# Patient Record
Sex: Female | Born: 1955 | Race: Black or African American | Marital: Single | Smoking: Never smoker
Health system: Southern US, Community
[De-identification: ages and names within clinical notes are randomized; demographics above are authoritative.]

## PROBLEM LIST (undated history)

## (undated) DIAGNOSIS — N189 Chronic kidney disease, unspecified: Secondary | ICD-10-CM

## (undated) DIAGNOSIS — I10 Essential (primary) hypertension: Secondary | ICD-10-CM

## (undated) DIAGNOSIS — I639 Cerebral infarction, unspecified: Secondary | ICD-10-CM

## (undated) DIAGNOSIS — D509 Iron deficiency anemia, unspecified: Secondary | ICD-10-CM

## (undated) HISTORY — PX: TUBAL LIGATION: SHX77

---

## 2014-01-07 ENCOUNTER — Other Ambulatory Visit: Payer: Self-pay | Admitting: Nephrology

## 2014-01-07 DIAGNOSIS — N183 Chronic kidney disease, stage 3 unspecified: Secondary | ICD-10-CM

## 2014-01-07 DIAGNOSIS — I10 Essential (primary) hypertension: Secondary | ICD-10-CM

## 2014-01-24 ENCOUNTER — Other Ambulatory Visit: Payer: Self-pay

## 2014-01-27 ENCOUNTER — Ambulatory Visit
Admission: RE | Admit: 2014-01-27 | Discharge: 2014-01-27 | Disposition: A | Discharge status: Home / Self Care | Payer: Medicaid Other | Source: Ambulatory Visit | Attending: Nephrology | Admitting: Nephrology

## 2014-01-27 DIAGNOSIS — I10 Essential (primary) hypertension: Secondary | ICD-10-CM

## 2014-01-27 DIAGNOSIS — N183 Chronic kidney disease, stage 3 unspecified: Secondary | ICD-10-CM

## 2014-02-27 ENCOUNTER — Inpatient Hospital Stay (HOSPITAL_COMMUNITY)
Admission: AD | Admit: 2014-02-27 | Discharge: 2014-03-01 | DRG: 682 | Disposition: A | Discharge status: Home / Self Care | Payer: Medicaid Other | Source: Other Acute Inpatient Hospital | Attending: Internal Medicine | Admitting: Internal Medicine

## 2014-02-27 ENCOUNTER — Encounter (HOSPITAL_COMMUNITY): Payer: Self-pay | Admitting: Family Medicine

## 2014-02-27 DIAGNOSIS — I129 Hypertensive chronic kidney disease with stage 1 through stage 4 chronic kidney disease, or unspecified chronic kidney disease: Secondary | ICD-10-CM | POA: Diagnosis present

## 2014-02-27 DIAGNOSIS — I959 Hypotension, unspecified: Secondary | ICD-10-CM | POA: Diagnosis present

## 2014-02-27 DIAGNOSIS — Z8673 Personal history of transient ischemic attack (TIA), and cerebral infarction without residual deficits: Secondary | ICD-10-CM | POA: Diagnosis not present

## 2014-02-27 DIAGNOSIS — E876 Hypokalemia: Secondary | ICD-10-CM | POA: Diagnosis not present

## 2014-02-27 DIAGNOSIS — Z8249 Family history of ischemic heart disease and other diseases of the circulatory system: Secondary | ICD-10-CM | POA: Diagnosis not present

## 2014-02-27 DIAGNOSIS — Z23 Encounter for immunization: Secondary | ICD-10-CM | POA: Diagnosis not present

## 2014-02-27 DIAGNOSIS — Z841 Family history of disorders of kidney and ureter: Secondary | ICD-10-CM

## 2014-02-27 DIAGNOSIS — E860 Dehydration: Secondary | ICD-10-CM | POA: Diagnosis present

## 2014-02-27 DIAGNOSIS — K029 Dental caries, unspecified: Secondary | ICD-10-CM | POA: Diagnosis present

## 2014-02-27 DIAGNOSIS — N179 Acute kidney failure, unspecified: Secondary | ICD-10-CM | POA: Diagnosis present

## 2014-02-27 DIAGNOSIS — J189 Pneumonia, unspecified organism: Secondary | ICD-10-CM | POA: Diagnosis present

## 2014-02-27 DIAGNOSIS — J42 Unspecified chronic bronchitis: Secondary | ICD-10-CM | POA: Diagnosis present

## 2014-02-27 DIAGNOSIS — Z7982 Long term (current) use of aspirin: Secondary | ICD-10-CM | POA: Diagnosis not present

## 2014-02-27 DIAGNOSIS — I10 Essential (primary) hypertension: Secondary | ICD-10-CM | POA: Diagnosis present

## 2014-02-27 DIAGNOSIS — I639 Cerebral infarction, unspecified: Secondary | ICD-10-CM | POA: Diagnosis present

## 2014-02-27 DIAGNOSIS — N184 Chronic kidney disease, stage 4 (severe): Secondary | ICD-10-CM | POA: Diagnosis present

## 2014-02-27 DIAGNOSIS — E875 Hyperkalemia: Secondary | ICD-10-CM | POA: Diagnosis present

## 2014-02-27 DIAGNOSIS — N170 Acute kidney failure with tubular necrosis: Secondary | ICD-10-CM | POA: Diagnosis present

## 2014-02-27 DIAGNOSIS — N182 Chronic kidney disease, stage 2 (mild): Secondary | ICD-10-CM | POA: Diagnosis present

## 2014-02-27 HISTORY — DX: Essential (primary) hypertension: I10

## 2014-02-27 HISTORY — DX: Cerebral infarction, unspecified: I63.9

## 2014-02-27 HISTORY — DX: Chronic kidney disease, unspecified: N18.9

## 2014-02-27 HISTORY — DX: Iron deficiency anemia, unspecified: D50.9

## 2014-02-27 LAB — GLUCOSE, CAPILLARY
GLUCOSE-CAPILLARY: 108 mg/dL — AB (ref 70–99)
Glucose-Capillary: 74 mg/dL (ref 70–99)
Glucose-Capillary: 159 mg/dL — ABNORMAL HIGH (ref 70–99)

## 2014-02-27 LAB — CBC
HCT: 40.5 % (ref 36.0–46.0)
Hemoglobin: 13.3 g/dL (ref 12.0–15.0)
MCH: 24.3 pg — AB (ref 26.0–34.0)
MCHC: 32.8 g/dL (ref 30.0–36.0)
MCV: 73.9 fL — ABNORMAL LOW (ref 78.0–100.0)
PLATELETS: 214 10*3/uL (ref 150–400)
RBC: 5.48 MIL/uL — ABNORMAL HIGH (ref 3.87–5.11)
RDW: 15 % (ref 11.5–15.5)
WBC: 10.2 10*3/uL (ref 4.0–10.5)

## 2014-02-27 LAB — BASIC METABOLIC PANEL
BUN: 81 mg/dL — ABNORMAL HIGH (ref 6–23)
BUN: 70 mg/dL — AB (ref 6–23)
CALCIUM: 9.6 mg/dL (ref 8.4–10.5)
CO2: 22 mEq/L (ref 19–32)
CO2: 25 mEq/L (ref 19–32)
Calcium: 8.8 mg/dL (ref 8.4–10.5)
Chloride: 94 mEq/L — ABNORMAL LOW (ref 96–112)
Chloride: 93 mEq/L — ABNORMAL LOW (ref 96–112)
Creatinine, Ser: 10.52 mg/dL — ABNORMAL HIGH (ref 0.50–1.10)
Creatinine, Ser: 7.29 mg/dL — ABNORMAL HIGH (ref 0.50–1.10)
GFR calc Af Amer: 4 mL/min — ABNORMAL LOW (ref >90)
GFR calc non Af Amer: 4 mL/min — ABNORMAL LOW (ref >90)
GFR, EST AFRICAN AMERICAN: 6 mL/min — AB (ref >90)
GFR, EST NON AFRICAN AMERICAN: 6 mL/min — AB (ref >90)
GLUCOSE: 96 mg/dL (ref 70–99)
Glucose, Bld: 87 mg/dL (ref 70–99)
Potassium: 4.7 mEq/L (ref 3.7–5.3)
Potassium: 3.6 mEq/L — ABNORMAL LOW (ref 3.7–5.3)
SODIUM: 136 meq/L — AB (ref 137–147)
Sodium: 143 mEq/L (ref 137–147)

## 2014-02-27 MED ORDER — SODIUM CHLORIDE 0.9 % IJ SOLN
3.0000 mL | Freq: Two times a day (BID) | INTRAMUSCULAR | Status: DC
  Administered 2014-02-27 – 2014-02-28 (×3): 3 mL via INTRAVENOUS

## 2014-02-27 MED ORDER — HEPARIN SODIUM (PORCINE) 5000 UNIT/ML IJ SOLN
5000.0000 [IU] | Freq: Three times a day (TID) | INTRAMUSCULAR | Status: DC
  Administered 2014-02-27 – 2014-03-01 (×6): 5000 [IU] via SUBCUTANEOUS
  Filled 2014-02-27 (×9): qty 1

## 2014-02-27 MED ORDER — SODIUM CHLORIDE 0.9 % IV SOLN
INTRAVENOUS | Status: DC
  Administered 2014-02-27: 300 mL via INTRAVENOUS

## 2014-02-27 NOTE — H&P (Signed)
[facility] History and Physical  Essie ChristineGail Caputo WUJ:[id number] DOB: [birth year] DOA: 02/27/2014  [facility] PCP: No PCP Per Patient  Specialists: none yet  Chief Complaint:    HPI: 58 y/o ?, known h/o hypertension, potential early kidney disease [ had ultrasound performed there] CVA 2013-follows with [facility] in [place] had her teeth extracted on 02/15/14. She started to take ibuprofen 400 mg tablets every 6 hourly scheduled and then on 5/14 started having nausea vomiting and inability to take by mouth. She has been eating very [place] drinking very [place] in addition to taking her lisinopril as scheduled. She started to feel 100 dizzy and weak this morning and because she was feeling ill, was [facility].  Workup there revealed potassium of 6 creatinine of 12 estimated GFR of 3 EKG showed some T wave elevations WBC 11.6, hemoglobin 14  Patient was transferred over for potential need for acute dialysis however she tells me she would not really want dialysis      Review of Systems: The patient denies fever chills nausea vomiting chest pain blurred vision double vision weakness in any one side of the body shortness of breath She states that she's been passing less urine than usual the past couple of days. Mildly constipated   No past medical history on file. No past surgical history on file. Social History:  History   Social History Narrative  . No narrative on file    Allergies not on file  Family History  Problem Relation Age of Onset  . CAD Father   . Renal Disease Mother     dilaysis  . Hypertension Mother     Prior to Admission medications   Not on File   Physical Exam: Filed Vitals:   02/27/14 1232  BP: 97/63  Pulse: 80  Temp: 97.6 F (36.4 C)  TempSrc: Oral  Resp: 18  Height: 5\' 6"  (1.676 m)  Weight: 55.792 kg (123 lb)  SpO2: 97%     General:  Eomi, ncat-frail  Eyes:  no pallor no  icterus  ENT:  saw supple no JVD noted  Neck:  JVD no bruit  Cardiovascular:  S1-S2 no murmur or gallop  Respiratory:  clinically clear  Abdomen:  soft nontender nondistended  Skin:  no lower extremity edema  Musculoskeletal:  range of motion intact  Psychiatric:  euthymic  Neurologic:  grossly intact  Labs on Admission:  Basic Metabolic Panel: No results found for this basename: NA, K, CL, CO2, GLUCOSE, BUN, CREATININE, CALCIUM, MG, PHOS,  in the last 168 hours Liver Function Tests: No results found for this basename: AST, ALT, ALKPHOS, BILITOT, PROT, ALBUMIN,  in the last 168 hours No results found for this basename: LIPASE, AMYLASE,  in the last 168 hours No results found for this basename: AMMONIA,  in the last 168 hours CBC: No results found for this basename: WBC, NEUTROABS, HGB, HCT, MCV, PLT,  in the last 168 hours Cardiac Enzymes: No results found for this basename: CKTOTAL, CKMB, CKMBINDEX, TROPONINI,  in the last 168 hours  BNP (last 3 results) No results found for this basename: PROBNP,  in the last 8760 hours CBG:  Recent Labs Lab 02/27/14 1251  GLUCAP 74    Radiological Exams on Admission: No results found.  EKG: Independently reviewed.  as above  Assessment/Plan Active Problems:   AKI (acute kidney injury)-hydrate with saline 75 200 cc per hour. The neck acute 12. Kayexalate to be given a  potassium above 5.5 and nursing order placed. Strict I&O monitoring. If kidney function does not improve we will obtain nephrology consult. CalculatedFeNA and urine sodium/creatinine already sent--patient doesn't wish dialysis and hopefully this will resolve   Hyperkalemia secondary to above. Repeat Bmet q 8-minute right heart depending on results    Chronic kidney disease (CKD), stage IV (severe)As above   HTN (hypertension)-allow pressure to be elevated given moderate hypotension   dizziness-secondary to volume depletion NSAID use and poor by mouth intake.  Hydrate 100 cc per hour give diet   Stroke 2013-await restarting regular meds   Caries S/P 8 tooth extraction 02/15/14 cautioned regarding NSAIDs will follow up in-    Time spent:  45 Full family updated at bedside full code   Rhetta MuraJai-Gurmukh Calani Gick [facility] Pager [phone]  If 7PM-7AM, please contact night-coverage [email or web address] Password [email or web address] 02/27/2014, 1:05 PM

## 2014-02-28 ENCOUNTER — Inpatient Hospital Stay (HOSPITAL_COMMUNITY): Payer: Medicaid Other

## 2014-02-28 LAB — BASIC METABOLIC PANEL
BUN: 66 mg/dL — ABNORMAL HIGH (ref 6–23)
CALCIUM: 8.7 mg/dL (ref 8.4–10.5)
CO2: 25 meq/L (ref 19–32)
Chloride: 94 mEq/L — ABNORMAL LOW (ref 96–112)
Creatinine, Ser: 5.45 mg/dL — ABNORMAL HIGH (ref 0.50–1.10)
GFR calc Af Amer: 9 mL/min — ABNORMAL LOW (ref >90)
GFR calc non Af Amer: 8 mL/min — ABNORMAL LOW (ref >90)
GLUCOSE: 90 mg/dL (ref 70–99)
Potassium: 3.4 mEq/L — ABNORMAL LOW (ref 3.7–5.3)
Sodium: 135 mEq/L — ABNORMAL LOW (ref 137–147)

## 2014-02-28 LAB — GLUCOSE, CAPILLARY
GLUCOSE-CAPILLARY: 120 mg/dL — AB (ref 70–99)
Glucose-Capillary: 90 mg/dL (ref 70–99)
Glucose-Capillary: 103 mg/dL — ABNORMAL HIGH (ref 70–99)
Glucose-Capillary: 110 mg/dL — ABNORMAL HIGH (ref 70–99)

## 2014-02-28 LAB — URINALYSIS, ROUTINE W REFLEX MICROSCOPIC
BILIRUBIN URINE: NEGATIVE
Glucose, UA: NEGATIVE mg/dL
Ketones, ur: 15 mg/dL — AB
Nitrite: POSITIVE — AB
PH: 5.5 (ref 5.0–8.0)
Protein, ur: NEGATIVE mg/dL
SPECIFIC GRAVITY, URINE: 1.021 (ref 1.005–1.030)
Urobilinogen, UA: 1 mg/dL (ref 0.0–1.0)

## 2014-02-28 LAB — CREATININE, URINE, RANDOM: Creatinine, Urine: 207.59 mg/dL

## 2014-02-28 LAB — URINE MICROSCOPIC-ADD ON

## 2014-02-28 LAB — SODIUM, URINE, RANDOM: Sodium, Ur: 51 mEq/L

## 2014-02-28 MED ORDER — LEVOFLOXACIN IN D5W 750 MG/150ML IV SOLN
750.0000 mg | Freq: Once | INTRAVENOUS | Status: AC
  Administered 2014-02-28: 750 mg via INTRAVENOUS
  Filled 2014-02-28: qty 150

## 2014-02-28 MED ORDER — LEVOFLOXACIN IN D5W 500 MG/100ML IV SOLN: 500.0000 mg | INTRAVENOUS | Status: DC

## 2014-02-28 MED ORDER — SODIUM CHLORIDE 0.9 % IV SOLN
INTRAVENOUS | Status: DC
  Administered 2014-02-28 – 2014-03-01 (×3): via INTRAVENOUS

## 2014-02-28 MED ORDER — SODIUM CHLORIDE 0.9 % IV BOLUS (SEPSIS)
500.0000 mL | Freq: Once | INTRAVENOUS | Status: AC
  Administered 2014-02-28: 13:00:00 via INTRAVENOUS

## 2014-02-28 MED ORDER — ENSURE COMPLETE PO LIQD
237.0000 mL | Freq: Three times a day (TID) | ORAL | Status: DC
  Administered 2014-02-28 – 2014-03-01 (×4): 237 mL via ORAL

## 2014-02-28 NOTE — Progress Notes (Signed)
[facility] PROGRESS NOTE  Caroline Moore ZOX:[id number] DOB: 1956-06-05 DOA: 02/27/2014 PCP: No PCP Per Patient  Assessment/Plan: 58 y/o with PMH of HTN, ?CKD had her teeth extracted on 5/15 was taking PO NSAID+ACE with poor PO intake found to have AKI , hyper K  -[facility]. potassium of 6 creatinine of 12   1. AKI on CKD (unclear history) likely ATN due to NSAID+ACE with poor PO intake  -renal US (12/2013): No Doppler suggestion of hemodynamically significant renal artery stenosis or other lesion   -Cr is imp[roving; not clear I/O, but patient is not oliguric  -cont iVF/increase; obtain UA; strict I/O, daily weight;   2. HTN currently dehydration hypotensive;  -hold BP meds; monitor   3. Tobacco, chronic bronchitis; obtain CXR; cont bronchodilators prn;  -stop smoking   4. Hyper K due to #1; improving, currently mild hypo K; replace prn   Code Status: full Family Communication:  D/w patient  (indicate person spoken with, relationship, and if by phone, the number) Disposition Plan: home 24-48 hours    Consultants:  none  Procedures:  none  Antibiotics:  none (indicate start date, and stop date if known)  HPI/Subjective: alert  Objective: Filed Vitals:   02/28/14 0506  BP: 79/63  Pulse: 80  Temp:   Resp:     Intake/Output Summary (Last 24 hours) at 02/28/14 1040 Last data filed at 02/28/14 0225  Gross per 24 hour  Intake     10 ml  Output    200 ml  Net   -190 ml   Filed Weights   02/27/14 1232 02/28/14 0500  Weight: 55.792 kg (123 lb) 55.52 kg (122 lb 6.4 oz)    Exam:   General:  alert  Cardiovascular: s1,s2 rrr  Respiratory: fewcrackles   Abdomen: soft, nt,nd   Musculoskeletal: no LE edema   Data Reviewed: Basic Metabolic Panel:  Recent Labs Lab 02/27/14 1447 02/27/14 2205 02/28/14 0511  NA 143 136* 135*  K 4.7 3.6* 3.4*  CL 94* 93* 94*  CO2 22 25 25   GLUCOSE 96 87 90  BUN 81* 70* 66*  CREATININE 10.52*  7.29* 5.45*  CALCIUM 9.6 8.8 8.7   Liver Function Tests: No results found for this basename: AST, ALT, ALKPHOS, BILITOT, PROT, ALBUMIN,  in the last 168 hours No results found for this basename: LIPASE, AMYLASE,  in the last 168 hours No results found for this basename: AMMONIA,  in the last 168 hours CBC:  Recent Labs Lab 02/27/14 1447  WBC 10.2  HGB 13.3  HCT 40.5  MCV 73.9*  PLT 214   Cardiac Enzymes: No results found for this basename: CKTOTAL, CKMB, CKMBINDEX, TROPONINI,  in the last 168 hours BNP (last 3 results) No results found for this basename: PROBNP,  in the last 8760 hours CBG:  Recent Labs Lab 02/27/14 1251 02/27/14 1622 02/27/14 2100 02/28/14 0751  GLUCAP 74 159* 108* 90    No results found for this or any previous visit (from the past 240 hour(s)).   Studies: No results found.  Scheduled Meds: . heparin  5,000 Units Subcutaneous 3 times per day  . sodium chloride  3 mL Intravenous Q12H   Continuous Infusions: . sodium chloride 300 mL (02/27/14 1504)    Active Problems:   AKI (acute kidney injury)   Chronic kidney disease (CKD), stage IV (severe)   HTN (hypertension)   Stroke 2013   Caries S/P 8 tooth extraction 02/15/14  Time spent: >35 minutes     Esperanza SheetsUlugbek N Mahaila Tischer  [facility] Pager [phone]. If 7PM-7AM, please contact night-coverage at [email or web address], password [email or web address] 02/28/2014, 10:40 AM  LOS: 1 day

## 2014-02-28 NOTE — Progress Notes (Signed)
INITIAL NUTRITION ASSESSMENT  DOCUMENTATION CODES Per approved criteria  -Not Applicable   INTERVENTION:  Ensure Complete PO TID, each supplement provides 350 kcal and 13 grams of protein  Recommend liberalize diet to regular, patient no longer needs a potassium restriction with K+ level of 3.4.  NUTRITION DIAGNOSIS: Inadequate oral intake related to difficulty chewing as evidenced by weight loss and intake of meals </= 25%.   Goal: Intake to meet >90% of estimated nutrition needs.  Monitor:  PO intake, labs, weight trend.  Reason for Assessment: MST  58 y.o. female  Admitting Dx: AKI on CKD  ASSESSMENT: 58 y/o with PMH of HTN, ?CKD had her teeth extracted on 5/5 was taking PO NSAID+ACE with poor PO intake found to have AKI , hyper K. Workup there revealed potassium of 6 creatinine of 12 estimated GFR of 3; EKG showed some T wave elevations; WBC 11.6, hemoglobin 14  Patient reports that she has been eating very poorly due to mouth pain from recent teeth extraction. She has also been feeling nauseous. She thinks that she has lost 5-10 lbs over the past two weeks.  Potassium now low at 3.4.  Nutrition Focused Physical Exam:  Subcutaneous Fat:  Orbital Region: WNL Upper Arm Region: WNL Thoracic and Lumbar Region: WNL  Muscle:  Temple Region: WNL Clavicle Bone Region: WNL Clavicle and Acromion Bone Region: WNL Scapular Bone Region: WNL Dorsal Hand: WNL Patellar Region: mild depletion Anterior Thigh Region: mild depletion Posterior Calf Region: mild depletion  Edema: no LE edema   Height: Ht Readings from Last 1 Encounters:  02/27/14 5\' 6"  (1.676 m)    Weight: Wt Readings from Last 1 Encounters:  02/28/14 122 lb 6.4 oz (55.52 kg)    Ideal Body Weight: 59.1 kg  % Ideal Body Weight: 94%  Wt Readings from Last 10 Encounters:  02/28/14 122 lb 6.4 oz (55.52 kg)    Usual Body Weight: 125 lb  % Usual Body Weight: 98%  BMI:  Body mass index is 19.77  kg/(m^2).  Estimated Nutritional Needs: Kcal: 1400-1600 Protein: 70-85 gm Fluid: 1.4-1.6 L  Skin: no wounds  Diet Order: Renal with 1200 ml fluid restriction  EDUCATION NEEDS: -Education needs addressed   Intake/Output Summary (Last 24 hours) at 02/28/14 1425 Last data filed at 02/28/14 1356  Gross per 24 hour  Intake   1630 ml  Output    200 ml  Net   1430 ml    Last BM: 5/17   Labs:   Recent Labs Lab 02/27/14 1447 02/27/14 2205 02/28/14 0511  NA 143 136* 135*  K 4.7 3.6* 3.4*  CL 94* 93* 94*  CO2 22 25 25   BUN 81* 70* 66*  CREATININE 10.52* 7.29* 5.45*  CALCIUM 9.6 8.8 8.7  GLUCOSE 96 87 90    CBG (last 3)   Recent Labs  02/27/14 2100 02/28/14 0751 02/28/14 1149  GLUCAP 108* 90 103*    Scheduled Meds: . heparin  5,000 Units Subcutaneous 3 times per day  . sodium chloride  3 mL Intravenous Q12H    Continuous Infusions: . sodium chloride 150 mL/hr at 02/28/14 1315    Past Medical History  Diagnosis Date  . Anemia, iron deficiency   . Hypertension   . Stroke   . Chronic kidney disease     No past surgical history on file.   Joaquin CourtsKimberly Harris, RD, LDN, CNSC Pager [phone] After Hours Pager [phone]

## 2014-02-28 NOTE — Progress Notes (Signed)
ANTIBIOTIC CONSULT NOTE - INITIAL  Pharmacy Consult for levaquin Indication: CAP and UTI  No Known Allergies  Patient Measurements: Height: 5\' 6"  (167.6 cm) Weight: 122 lb 6.4 oz (55.52 kg) IBW/kg (Calculated) : 59.3   Vital Signs: Temp: 97.8 F (36.6 C) (05/18 1354) Temp src: Oral (05/18 1354) BP: 111/45 mmHg (05/18 1354) Pulse Rate: 65 (05/18 1354) Intake/Output from previous day: 05/17 0701 - 05/18 0700 In: 10 [P.O.:10] Out: 200 [Urine:200] Intake/Output from this shift: Total I/O In: 1620 [P.O.:1120; I.V.:500] Out: -   Labs:  Recent Labs  02/27/14 1447 02/27/14 2205 02/28/14 0225 02/28/14 0511  WBC 10.2  --   --   --   HGB 13.3  --   --   --   PLT 214  --   --   --   LABCREA  --   --  207.59  --   CREATININE 10.52* 7.29*  --  5.45*   Estimated Creatinine Clearance: 10.1 ml/min (by C-G formula based on Cr of 5.45). No results found for this basename: VANCOTROUGH, VANCOPEAK, VANCORANDOM, GENTTROUGH, GENTPEAK, GENTRANDOM, TOBRATROUGH, TOBRAPEAK, TOBRARND, AMIKACINPEAK, AMIKACINTROU, AMIKACIN,  in the last 72 hours   Microbiology: No results found for this or any previous visit (from the past 720 hour(s)).  Medical History: Past Medical History  Diagnosis Date  . Anemia, iron deficiency   . Hypertension   . Stroke   . Chronic kidney disease     Medications:  Prescriptions prior to admission  Medication Sig Dispense Refill  . aspirin EC 81 MG tablet Take 81 mg by mouth daily.      Marland Kitchen. lisinopril (PRINIVIL,ZESTRIL) 20 MG tablet Take 40 mg by mouth every evening.      . penicillin v potassium (VEETID) 500 MG tablet Take 500 mg by mouth 4 (four) times daily.       Assessment: 58 yo F to start levaquin per pharmacy for CAP and UTI.  PMH: HTN, CKD. Teeth extracted 5/15 and taking PO NSAID and ACE with poor PO intake.  Found to have AKI with K 6 and creatinine of 12.   Cr is improving. Urine microscopic add on: few sq cellls, many bacteria, may WBC.  WBC 10.7,  creat 10.52>7.25>5.45.  Afebrile.  5/18  CXR: mild LUL infiltrate 5/18 levaquin>>   Goal of Therapy:  Eradicate infection  Plan:  1. levaquin 750 mg IV x 1 dose, then levaquin 500 mg IV q48hrs 2. F/u renal fxn, wbc, temp, CXR, culture data  Herby AbrahamMichelle T. Breezy Hertenstein, Pharm.D. [phone] 02/28/2014 4:41 PM

## 2014-02-28 NOTE — Progress Notes (Signed)
UR Completed Layla Kesling Graves-Bigelow, RN,BSN [phone]  

## 2014-03-01 DIAGNOSIS — J189 Pneumonia, unspecified organism: Secondary | ICD-10-CM

## 2014-03-01 LAB — BASIC METABOLIC PANEL
BUN: 25 mg/dL — ABNORMAL HIGH (ref 6–23)
CHLORIDE: 106 meq/L (ref 96–112)
CO2: 27 mEq/L (ref 19–32)
CREATININE: 1.55 mg/dL — AB (ref 0.50–1.10)
Calcium: 8.4 mg/dL (ref 8.4–10.5)
GFR, EST AFRICAN AMERICAN: 42 mL/min — AB (ref >90)
GFR, EST NON AFRICAN AMERICAN: 36 mL/min — AB (ref >90)
Glucose, Bld: 91 mg/dL (ref 70–99)
POTASSIUM: 3.5 meq/L — AB (ref 3.7–5.3)
Sodium: 144 mEq/L (ref 137–147)

## 2014-03-01 LAB — GLUCOSE, CAPILLARY: Glucose-Capillary: 104 mg/dL — ABNORMAL HIGH (ref 70–99)

## 2014-03-01 MED ORDER — ALUM & MAG HYDROXIDE-SIMETH 200-200-20 MG/5ML PO SUSP
15.0000 mL | Freq: Four times a day (QID) | ORAL | Status: DC | PRN
  Administered 2014-03-01: 15 mL via ORAL
  Filled 2014-03-01: qty 30

## 2014-03-01 MED ORDER — LEVOFLOXACIN 500 MG PO TABS: 500.0000 mg | ORAL_TABLET | Freq: Every day | ORAL | Status: DC

## 2014-03-01 NOTE — Discharge Summary (Signed)
Physician Discharge Summary  Caroline Moore ZOX:[id number] DOB: [birth year] DOA: 02/27/2014  PCP: No PCP Per Patient  Admit date: 02/27/2014 Discharge date: 03/01/2014  Time spent: >35 minutes  Recommendations for Outpatient Follow-up:  F/u with PCP in 1 week Discharge Diagnoses:  Active Problems:   AKI (acute kidney injury)   Chronic kidney disease (CKD), stage IV (severe)   HTN (hypertension)   Stroke 2013   Caries S/P 8 tooth extraction 02/15/14   Discharge Condition: stable   Diet recommendation: low sodium   Filed Weights   02/27/14 1232 02/28/14 0500 03/01/14 0506  Weight: 55.792 kg (123 lb) 55.52 kg (122 lb 6.4 oz) 55.792 kg (123 lb)    History of present illness:  58 y/o with PMH of HTN, ?CKD had her teeth extracted on 5/15 was taking PO NSAID+ACE with poor PO intake found to have AKI , hyper K  -[facility]. potassium of 6 creatinine of 12    Hospital Course:  1. AKI on CKD (unclear history) likely ATN due to NSAID+ACE with poor PO intake  -renal US (12/2013): No Doppler suggestion of hemodynamically significant renal artery stenosis or other lesion  -Cr is improved on IVF; (Cr 12-->1.5) ; patient is not oilguric   -d/w patient, she has PCP; recommended to f/u in 5 days with BMP  2. HTN; BP is stable off meds; cont OP evaluation for medication treatment as needed -d/c ACE due to AKI  3. CAP;' present on admission; Tobacco, chronic bronchitis; CXR: mild infiltrate  -clinically stable; afebrile; responding to atx; SOB resolved; cont OP atx; recommended to f/u CXR in 6 weeks -stop smoking  4. Hyper K due to #1; resolved       Procedures:  none (i.e. Studies not automatically included, echos, thoracentesis, etc; not x-rays)  Consultations:none  Discharge Exam: Filed Vitals:   03/01/14 0506  BP: 137/90  Pulse: 89  Temp:   Resp:     General: alert Cardiovascular: s1,s2 rrr Respiratory: CTA BL  Discharge Instructions  Discharge  Instructions   Diet - low sodium heart healthy    Complete by:  As directed      Discharge instructions    Complete by:  As directed   Please follow up with primary care doctor in 1 week     Increase activity slowly    Complete by:  As directed             Medication List    STOP taking these medications       lisinopril 20 MG tablet  Commonly known as:  PRINIVIL,ZESTRIL     penicillin v potassium 500 MG tablet  Commonly known as:  VEETID      TAKE these medications       aspirin EC 81 MG tablet  Take 81 mg by mouth daily.     levofloxacin 500 MG tablet  Commonly known as:  LEVAQUIN  Take 1 tablet (500 mg total) by mouth daily.       No Known Allergies     Follow-up Information   Follow up with No PCP Per Patient.   Specialty:  General Practice       The results of significant diagnostics from this hospitalization (including imaging, microbiology, ancillary and laboratory) are listed below for reference.    Significant Diagnostic Studies: Dg Chest Port 1 View  02/28/2014   CLINICAL DATA:  Cough.  EXAM: PORTABLE CHEST - 1 VIEW  COMPARISON:  11/29/2012.  FINDINGS: Mediastinum  and hilar structures normal. Mild left upper lobe infiltrate noted. Heart size normal. Degenerative changes thoracic spine.  IMPRESSION: Mild left upper lobe infiltrate.   Electronically Signed   By: Maisie Fushomas  Register   On: 02/28/2014 13:21    Microbiology: No results found for this or any previous visit (from the past 240 hour(s)).   Labs: Basic Metabolic Panel:  Recent Labs Lab 02/27/14 1447 02/27/14 2205 02/28/14 0511 03/01/14 0905  NA 143 136* 135* 144  K 4.7 3.6* 3.4* 3.5*  CL 94* 93* 94* 106  CO2 22 25 25 27   GLUCOSE 96 87 90 91  BUN 81* 70* 66* 25*  CREATININE 10.52* 7.29* 5.45* 1.55*  CALCIUM 9.6 8.8 8.7 8.4   Liver Function Tests: No results found for this basename: AST, ALT, ALKPHOS, BILITOT, PROT, ALBUMIN,  in the last 168 hours No results found for this basename:  LIPASE, AMYLASE,  in the last 168 hours No results found for this basename: AMMONIA,  in the last 168 hours CBC:  Recent Labs Lab 02/27/14 1447  WBC 10.2  HGB 13.3  HCT 40.5  MCV 73.9*  PLT 214   Cardiac Enzymes: No results found for this basename: CKTOTAL, CKMB, CKMBINDEX, TROPONINI,  in the last 168 hours BNP: BNP (last 3 results) No results found for this basename: PROBNP,  in the last 8760 hours CBG:  Recent Labs Lab 02/28/14 0751 02/28/14 1149 02/28/14 1623 02/28/14 2103 03/01/14 0734  GLUCAP 90 103* 110* 120* 104*       Signed:  Isaiah SergeUlugbek N Prestina Raigoza  [facility] 03/01/2014, 10:23 AM

## 2014-03-01 NOTE — Progress Notes (Signed)
Pt in stable condition, d/c'd via wheelchair with daughter to private vehicle

## 2014-06-06 ENCOUNTER — Inpatient Hospital Stay (HOSPITAL_COMMUNITY)
Admission: RE | Admit: 2014-06-06 | Discharge: 2014-06-09 | DRG: 682 | Disposition: A | Discharge status: Home / Self Care | Payer: Medicaid Other | Source: Other Acute Inpatient Hospital | Attending: Internal Medicine | Admitting: Internal Medicine

## 2014-06-06 DIAGNOSIS — D509 Iron deficiency anemia, unspecified: Secondary | ICD-10-CM | POA: Diagnosis present

## 2014-06-06 DIAGNOSIS — R634 Abnormal weight loss: Secondary | ICD-10-CM | POA: Diagnosis present

## 2014-06-06 DIAGNOSIS — T46905A Adverse effect of unspecified agents primarily affecting the cardiovascular system, initial encounter: Secondary | ICD-10-CM | POA: Diagnosis present

## 2014-06-06 DIAGNOSIS — Z7982 Long term (current) use of aspirin: Secondary | ICD-10-CM

## 2014-06-06 DIAGNOSIS — N184 Chronic kidney disease, stage 4 (severe): Secondary | ICD-10-CM | POA: Diagnosis present

## 2014-06-06 DIAGNOSIS — E46 Unspecified protein-calorie malnutrition: Secondary | ICD-10-CM

## 2014-06-06 DIAGNOSIS — Z8673 Personal history of transient ischemic attack (TIA), and cerebral infarction without residual deficits: Secondary | ICD-10-CM | POA: Diagnosis not present

## 2014-06-06 DIAGNOSIS — N182 Chronic kidney disease, stage 2 (mild): Secondary | ICD-10-CM | POA: Diagnosis present

## 2014-06-06 DIAGNOSIS — Z681 Body mass index (BMI) 19 or less, adult: Secondary | ICD-10-CM | POA: Diagnosis not present

## 2014-06-06 DIAGNOSIS — E860 Dehydration: Secondary | ICD-10-CM | POA: Diagnosis present

## 2014-06-06 DIAGNOSIS — I10 Essential (primary) hypertension: Secondary | ICD-10-CM | POA: Diagnosis present

## 2014-06-06 DIAGNOSIS — N179 Acute kidney failure, unspecified: Secondary | ICD-10-CM | POA: Diagnosis present

## 2014-06-06 DIAGNOSIS — I639 Cerebral infarction, unspecified: Secondary | ICD-10-CM

## 2014-06-06 DIAGNOSIS — E43 Unspecified severe protein-calorie malnutrition: Secondary | ICD-10-CM

## 2014-06-06 DIAGNOSIS — N170 Acute kidney failure with tubular necrosis: Principal | ICD-10-CM | POA: Diagnosis present

## 2014-06-06 DIAGNOSIS — I129 Hypertensive chronic kidney disease with stage 1 through stage 4 chronic kidney disease, or unspecified chronic kidney disease: Secondary | ICD-10-CM | POA: Diagnosis present

## 2014-06-06 DIAGNOSIS — K029 Dental caries, unspecified: Secondary | ICD-10-CM

## 2014-06-06 DIAGNOSIS — I6350 Cerebral infarction due to unspecified occlusion or stenosis of unspecified cerebral artery: Secondary | ICD-10-CM

## 2014-06-06 DIAGNOSIS — R1115 Cyclical vomiting syndrome unrelated to migraine: Secondary | ICD-10-CM

## 2014-06-06 DIAGNOSIS — N3000 Acute cystitis without hematuria: Secondary | ICD-10-CM

## 2014-06-06 DIAGNOSIS — N390 Urinary tract infection, site not specified: Secondary | ICD-10-CM | POA: Diagnosis present

## 2014-06-06 NOTE — H&P (Signed)
[facility] History and Physical  Caroline Moore WJX:[id number] DOB: 31-Mar-1956 DOA: 06/06/2014  Referring physician: [facility] PCP: No PCP Per Patient  Specialists: Nephrology  Chief Complaint: nausea  Assessment/Plan Active Problems:   * No active hospital problems. * AKI/CKD HTN Protein Calorie Malnutrition UTI  PT ARRIVED FROM [facility] W/O MEDICAL RECORDS. Records to be faxed to [facility]   AKI vs CKD vs ESRD: unsure of true baseline but carries Dx of CKD IV. Per report Pt Cr 6.3 and BUN 107, K 4.2. Pt is not on dialysis and makes urine. Followed by [employer] [facility] in [place]. Of note pt w/ similar admission in May w/ likely AKI induced by NSAIDs, ACEi and dehydration.  - Admit - FeNA - Tele - IVF NS 121ml/hr - CMET - Consult Nephrology if Cr does not improve  Intractable nausea: etiology unclear but pt w/ 40lb wt gain. Onset on March after dental extraction. Gastroparesis vs cyclic vomiting syndrome vs gastric outlet obstruction vs PUD vs GI malignancy though no melena. - Zofran PRN - Reglan - GI cocktail PRN - consider GI consult - CBC to look for anemia - Nutrition consult  HTN: hypotensive on admission.  - Hold home HCTZ  UTI: Per report pt w/ UTI w/ Greater than 20-30 WBC on UA micro. No WBC and afebrile per report. Pt completely asymptomatic. Given Rocephin x 1 at [facility] - Repeat UA, UCX - Hold ABX  H/O CVA: 2013. No sign of deficits today - Continue ASA  DVT Prophylaxis: Hep [place] TID (plt nml per report)  Code Status: FULL Family Communication: Discussed care plan w/ daughter in the room Disposition Plan: pending clinical improvement  HPI: Caroline Moore is a 58 y.o. female came to [facility] ed 06/07/2014 with  Nausea off and on since March when pt had teeth pulled. Getting progressively more week. Went to [facility] today after leaving the dentists office feeling extremely week. Pt feels that she has been unable to keep  very much food down during this time. All food makes pt nauseaus. Pt has not gone to doctor for help w/ this. HAs not taken any medications for this. Pt is not on dialysis. Continues to make urine. No Graft in place. Denies any frequency, dysuria, abd pain, diarrhea, hematochezia, CP, SOB. 40lb wt loss during this time frame. Last colonoscopy at 51 and told it was nml w/ next on scheduled for next year.   Review of Systems: Per HPI w/ all other systems negative.   Past Medical History  Diagnosis Date  . Anemia, iron deficiency   . Hypertension   . Stroke   . Chronic kidney disease    No past surgical history on file. Social History:  History   Social History Narrative  . No narrative on file    No Known Allergies  Family History  Problem Relation Age of Onset  . CAD Father   . Renal Disease Mother     dilaysis  . Hypertension Mother     Prior to Admission medications   Medication Sig Start Date End Date Taking? Authorizing Provider  aspirin EC 81 MG tablet Take 81 mg by mouth daily.    Historical Provider, MD  levofloxacin (LEVAQUIN) 500 MG tablet Take 1 tablet (500 mg total) by mouth daily. 03/01/14   Esperanza Sheets, MD   Physical Exam: Filed Vitals:   06/06/14 2135 06/07/14 0037  BP:  104/66  Pulse: 85   Temp: 98.6 F (37 C)  TempSrc: Oral   Resp: 16   Height:  (1.676 m)   Weight: 45.2 kg (99 lb 10.4 oz)   SpO2: 96%      General:  Thin and frail  Eyes: EOMI, PERRL  ENT: dry mm  Neck: soft, no masses  Cardiovascular: RRR, no m/r/g  Respiratory: CTAB, nml effort  Abdomen: NABS, nonttp  Skin: intact, no rash  Musculoskeletal: no effusitons, no LE edema  Psychiatric: nml affect  Neurologic: CN 2-12 grossly intact  Labs on Admission:  Basic Metabolic Panel: No results found for this basename: NA, K, CL, CO2, GLUCOSE, BUN, CREATININE, CALCIUM, MG, PHOS,  in the last 168 hours Liver Function Tests: No results found for this basename: AST,  ALT, ALKPHOS, BILITOT, PROT, ALBUMIN,  in the last 168 hours No results found for this basename: LIPASE, AMYLASE,  in the last 168 hours No results found for this basename: AMMONIA,  in the last 168 hours CBC: No results found for this basename: WBC, NEUTROABS, HGB, HCT, MCV, PLT,  in the last 168 hours Cardiac Enzymes: No results found for this basename: CKTOTAL, CKMB, CKMBINDEX, TROPONINI,  in the last 168 hours  BNP (last 3 results) No results found for this basename: PROBNP,  in the last 8760 hours CBG: No results found for this basename: GLUCAP,  in the last 168 hours  Radiological Exams on Admission: No results found.     Time spent: Greater than 70 min in direct pt care and coordination   MERRELL, DAVID J, MD [facility] [email or web address] Password [facility] 06/07/2014, 12:52 AM

## 2014-06-06 NOTE — Progress Notes (Signed)
Pt arrived on unit, alert and oriented x4. Able to make needs known. In no acute distress, no SOB noted. Vital signs taken and stable. LFA IV clean dry and intact. Skin warm, and dry to touched, intact as assessed. Pt care guide provided. Oriented to room and staff. Paged flow manager for MD orders. Awaiting orders. We will continue to monitor.

## 2014-06-07 ENCOUNTER — Inpatient Hospital Stay (HOSPITAL_COMMUNITY): Payer: Medicaid Other

## 2014-06-07 ENCOUNTER — Encounter (HOSPITAL_COMMUNITY): Payer: Self-pay | Admitting: *Deleted

## 2014-06-07 DIAGNOSIS — N3000 Acute cystitis without hematuria: Secondary | ICD-10-CM

## 2014-06-07 LAB — URINE MICROSCOPIC-ADD ON

## 2014-06-07 LAB — COMPREHENSIVE METABOLIC PANEL
ALT: 7 U/L (ref 0–35)
AST: 12 U/L (ref 0–37)
Albumin: 3 g/dL — ABNORMAL LOW (ref 3.5–5.2)
Alkaline Phosphatase: 70 U/L (ref 39–117)
Anion gap: 13 (ref 5–15)
BUN: 74 mg/dL — ABNORMAL HIGH (ref 6–23)
CO2: 24 mEq/L (ref 19–32)
Calcium: 8.5 mg/dL (ref 8.4–10.5)
Chloride: 103 mEq/L (ref 96–112)
Creatinine, Ser: 3.84 mg/dL — ABNORMAL HIGH (ref 0.50–1.10)
GFR calc Af Amer: 14 mL/min — ABNORMAL LOW (ref >90)
GFR calc non Af Amer: 12 mL/min — ABNORMAL LOW (ref >90)
Glucose, Bld: 87 mg/dL (ref 70–99)
POTASSIUM: 4.1 meq/L (ref 3.7–5.3)
SODIUM: 140 meq/L (ref 137–147)
TOTAL PROTEIN: 6.6 g/dL (ref 6.0–8.3)
Total Bilirubin: <0.2 mg/dL — ABNORMAL LOW (ref 0.3–1.2)

## 2014-06-07 LAB — DIFFERENTIAL
BASOS PCT: 1 % (ref 0–1)
Basophils Absolute: 0 10*3/uL (ref 0.0–0.1)
Eosinophils Absolute: 0.2 10*3/uL (ref 0.0–0.7)
Eosinophils Relative: 5 % (ref 0–5)
Lymphocytes Relative: 39 % (ref 12–46)
Lymphs Abs: 1.8 10*3/uL (ref 0.7–4.0)
Monocytes Absolute: 0.4 10*3/uL (ref 0.1–1.0)
Monocytes Relative: 9 % (ref 3–12)
NEUTROS ABS: 2.1 10*3/uL (ref 1.7–7.7)
NEUTROS PCT: 46 % (ref 43–77)

## 2014-06-07 LAB — HEMOGLOBIN A1C
Hgb A1c MFr Bld: 5.7 % — ABNORMAL HIGH (ref <5.7)
MEAN PLASMA GLUCOSE: 117 mg/dL — AB (ref <117)

## 2014-06-07 LAB — CBC
HCT: 33.3 % — ABNORMAL LOW (ref 36.0–46.0)
HEMOGLOBIN: 10.7 g/dL — AB (ref 12.0–15.0)
MCH: 24.5 pg — ABNORMAL LOW (ref 26.0–34.0)
MCHC: 32.1 g/dL (ref 30.0–36.0)
MCV: 76.2 fL — ABNORMAL LOW (ref 78.0–100.0)
Platelets: 217 10*3/uL (ref 150–400)
RBC: 4.37 MIL/uL (ref 3.87–5.11)
RDW: 14.8 % (ref 11.5–15.5)
WBC: 4.6 10*3/uL (ref 4.0–10.5)

## 2014-06-07 LAB — URINALYSIS, ROUTINE W REFLEX MICROSCOPIC
Bilirubin Urine: NEGATIVE
GLUCOSE, UA: NEGATIVE mg/dL
Ketones, ur: NEGATIVE mg/dL
Nitrite: NEGATIVE
Protein, ur: NEGATIVE mg/dL
Specific Gravity, Urine: 1.015 (ref 1.005–1.030)
UROBILINOGEN UA: 0.2 mg/dL (ref 0.0–1.0)
pH: 5.5 (ref 5.0–8.0)

## 2014-06-07 LAB — MAGNESIUM: MAGNESIUM: 2.7 mg/dL — AB (ref 1.5–2.5)

## 2014-06-07 LAB — CREATININE, URINE, RANDOM: Creatinine, Urine: 182.7 mg/dL

## 2014-06-07 LAB — OCCULT BLOOD X 1 CARD TO LAB, STOOL: FECAL OCCULT BLD: NEGATIVE

## 2014-06-07 LAB — SODIUM, URINE, RANDOM: Sodium, Ur: 28 mEq/L

## 2014-06-07 MED ORDER — ONDANSETRON HCL 4 MG PO TABS: 4.0000 mg | ORAL_TABLET | Freq: Four times a day (QID) | ORAL | Status: DC | PRN

## 2014-06-07 MED ORDER — FAMOTIDINE 20 MG PO TABS
20.0000 mg | ORAL_TABLET | Freq: Two times a day (BID) | ORAL | Status: DC | PRN
  Administered 2014-06-07: 20 mg via ORAL
  Filled 2014-06-07: qty 1

## 2014-06-07 MED ORDER — GI COCKTAIL ~~LOC~~
30.0000 mL | Freq: Once | ORAL | Status: AC
  Administered 2014-06-07: 30 mL via ORAL
  Filled 2014-06-07: qty 30

## 2014-06-07 MED ORDER — DEXTROSE 5 % IV SOLN
1.0000 g | INTRAVENOUS | Status: DC
  Administered 2014-06-07 – 2014-06-08 (×2): 1 g via INTRAVENOUS
  Filled 2014-06-07 (×3): qty 10

## 2014-06-07 MED ORDER — ONDANSETRON HCL 4 MG/2ML IJ SOLN
4.0000 mg | Freq: Four times a day (QID) | INTRAMUSCULAR | Status: DC | PRN
  Administered 2014-06-07: 4 mg via INTRAVENOUS
  Filled 2014-06-07: qty 2

## 2014-06-07 MED ORDER — BOOST / RESOURCE BREEZE PO LIQD
1.0000 | Freq: Three times a day (TID) | ORAL | Status: DC
  Administered 2014-06-07 – 2014-06-09 (×7): 1 via ORAL

## 2014-06-07 MED ORDER — HYDROCHLOROTHIAZIDE 12.5 MG PO CAPS: 12.5000 mg | ORAL_CAPSULE | Freq: Every day | ORAL | Status: DC

## 2014-06-07 MED ORDER — HEPARIN SODIUM (PORCINE) 5000 UNIT/ML IJ SOLN
5000.0000 [IU] | Freq: Three times a day (TID) | INTRAMUSCULAR | Status: DC
Start: 2014-06-07 — End: 2014-06-09
  Administered 2014-06-07 – 2014-06-09 (×7): 5000 [IU] via SUBCUTANEOUS
  Filled 2014-06-07 (×10): qty 1

## 2014-06-07 MED ORDER — ACETAMINOPHEN 325 MG PO TABS: 650.0000 mg | ORAL_TABLET | Freq: Four times a day (QID) | ORAL | Status: DC | PRN

## 2014-06-07 MED ORDER — ASPIRIN EC 81 MG PO TBEC
81.0000 mg | DELAYED_RELEASE_TABLET | Freq: Every day | ORAL | Status: DC
  Administered 2014-06-07 – 2014-06-09 (×3): 81 mg via ORAL
  Filled 2014-06-07 (×3): qty 1

## 2014-06-07 MED ORDER — SODIUM CHLORIDE 0.9 % IV BOLUS (SEPSIS)
500.0000 mL | Freq: Once | INTRAVENOUS | Status: AC
  Administered 2014-06-07: 500 mL via INTRAVENOUS

## 2014-06-07 MED ORDER — METOCLOPRAMIDE HCL 10 MG PO TABS
10.0000 mg | ORAL_TABLET | Freq: Every day | ORAL | Status: DC
  Administered 2014-06-07 – 2014-06-09 (×3): 10 mg via ORAL
  Filled 2014-06-07 (×3): qty 1

## 2014-06-07 MED ORDER — ACETAMINOPHEN 650 MG RE SUPP: 650.0000 mg | Freq: Four times a day (QID) | RECTAL | Status: DC | PRN

## 2014-06-07 MED ORDER — SODIUM CHLORIDE 0.9 % IV SOLN
INTRAVENOUS | Status: DC
Start: 2014-06-07 — End: 2014-06-09
  Administered 2014-06-07 (×4): via INTRAVENOUS
  Administered 2014-06-08: 1000 mL via INTRAVENOUS
  Administered 2014-06-08 (×2): via INTRAVENOUS

## 2014-06-07 NOTE — Progress Notes (Signed)
ANTIBIOTIC CONSULT NOTE - INITIAL  Pharmacy Consult for Rocephin/medication history Indication: UTI  No Known Allergies  Patient Measurements: Height:  (167.6 cm) Weight: 99 lb 10.4 oz (45.2 kg) IBW/kg (Calculated) : 59.3  Vital Signs: Temp: 97.7 F (36.5 C) (08/25 1330) Temp src: Oral (08/25 1330) BP: 86/59 mmHg (08/25 1330) Pulse Rate: 74 (08/25 1330) Intake/Output from previous day: 08/24 0701 - 08/25 0700 In: -  Out: 200 [Urine:200] Intake/Output from this shift: Total I/O In: 240 [P.O.:240] Out: -   Labs:  Recent Labs  06/07/14 0136 06/07/14 0715  WBC  --  4.6  HGB  --  10.7*  PLT  --  217  LABCREA 182.70  --   CREATININE  --  3.84*   Estimated Creatinine Clearance: 11.5 ml/min (by C-G formula based on Cr of 3.84). No results found for this basename: VANCOTROUGH, VANCOPEAK, VANCORANDOM, GENTTROUGH, GENTPEAK, GENTRANDOM, TOBRATROUGH, TOBRAPEAK, TOBRARND, AMIKACINPEAK, AMIKACINTROU, AMIKACIN,  in the last 72 hours   Microbiology: No results found for this or any previous visit (from the past 720 hour(s)).  Medical History: Past Medical History  Diagnosis Date  . Anemia, iron deficiency   . Hypertension   . Stroke   . Chronic kidney disease    Assessment: 60 YOF with HTN, CKD, transferred from [place] with AKI and UTI. Pharmacy is consulted to start rocephin. Urine culture pending  Pt. Had recent admission for AKI in May 2015, and lisinopril was stopped at discharge. However, per pt, she is still taking lisinopril prior to this admission. She said other than lisinopril and aspirin, she was not taking any other medications. [facility]. Per [facility] record she filled her lisinopril (  daily) and omeprazole in June 8th. [facility] only shows she filled a course of penicillin and hydrocodone in May.   Her current medication was reviewed, no other medication could potentially cause AKI.  Goal of Therapy:   Resolution of infection  Plan:  - Rocephin 1g IV Q 24 hrs - Pharmacy sign off  Thanks.  Bayard Hugger, PharmD, BCPS  Clinical Pharmacist  Pager: [phone]   06/07/2014,3:14 PM

## 2014-06-07 NOTE — Progress Notes (Signed)
Utilization review completed.  

## 2014-06-07 NOTE — Progress Notes (Signed)
INITIAL NUTRITION ASSESSMENT  DOCUMENTATION CODES Per approved criteria  -Severe malnutrition in the context of chronic illness  Pt meets criteria for severe MALNUTRITION in the context of chronic illness as evidenced by weight loss of 26%/ 5 months and severe muscle and subcutaneous fat depletion.  INTERVENTION:  Provide Resource Breeze po TID, each supplement provides 250 kcal and 9 grams of protein.  Encourage PO intake.  Will continue to monitor  NUTRITION DIAGNOSIS: Inadequate oral intake related to nausea, decreased appetite as evidenced by weight loss of 26% x 5 months.   Goal: Pt to meet >/= 90% of their estimated nutrition needs   Monitor:  PO intake and supplement tolerance, weights, labs  Reason for Assessment: Consult for nutritional assessment   Admitting Dx: <principal problem not specified>  ASSESSMENT: 58 y.o. female came [facility] ed 06/07/2014 with nausea off and on since March when pt had teeth pulled. Getting progressively more week. Went to [facility] today after leaving the dentists office feeling extremely week. Pt feels that she has been unable to keep very much food down during this time. All food makes pt nauseaus. Pt has not gone to doctor for help w/ this. HAs not taken any medications for this. Pt is not on dialysis. 40lb wt loss during this time frame.  PMH of CKD stage IV, not currently on dialysis.  Pt reports having multiple tooth extractions in March, wt loss of around 40 lbs/ 5 months. States that she has had no appetite for some time now.  Reported UBW: 134 lb. Encouraged pt to eat as much as she can tolerate. Pt is willing to try supplement with meals, will send Resource Breeze TID.  Nutrition Focused Physical Exam:  Subcutaneous Fat:  Orbital Region: WNL Upper Arm Region: severe depletion Thoracic and Lumbar Region: severe depletion  Muscle:  Temple Region: severe depletion Clavicle Bone Region: moderate depletion Clavicle  and Acromion Bone Region: moderate depletion Scapular Bone Region: severe depletion Dorsal Hand: moderate depletion Patellar Region: severe depletion Anterior Thigh Region: severe depletion Posterior Calf Region: severe depletion  Edema: no LE edema  Labs reviewed:  High BUN High Creatinine GFR =14  Height: Ht Readings from Last 1 Encounters:  06/06/14  (1.676 m)    Weight: Wt Readings from Last 1 Encounters:  06/06/14 99 lb 10.4 oz (45.2 kg)    Ideal Body Weight: 130 lb  % Ideal Body Weight: 76%  Wt Readings from Last 10 Encounters:  06/06/14 99 lb 10.4 oz (45.2 kg)  03/01/14 123 lb (55.792 kg)    Usual Body Weight: 134 lb  % Usual Body Weight: 74%  BMI:  Body mass index is 16.09 kg/(m^2).  Estimated Nutritional Needs: Kcal: 1350-1550 Protein: 50-65 grams Fluid: No fluid restriction, per MD  Skin: knee abrasion  Diet Order: Renal  EDUCATION NEEDS: -No education needs identified at this time   Intake/Output Summary (Last 24 hours) at 06/07/14 1020 Last data filed at 06/07/14 0920  Gross per 24 hour  Intake    240 ml  Output    200 ml  Net     40 ml    Last BM: 8/24   Labs:   Recent Labs Lab 06/07/14 0715  NA 140  K 4.1  CL 103  CO2 24  BUN 74*  CREATININE 3.84*  CALCIUM 8.5  MG 2.7*  GLUCOSE 87    CBG (last 3)  No results found for this basename: GLUCAP,  in the last 72  hours  Scheduled Meds: . aspirin EC  81 mg Oral Daily  . cefTRIAXone (ROCEPHIN)  IV  1 g Intravenous Q24H  . heparin  5,000 Units Subcutaneous 3 times per day  . metoCLOPramide  10 mg Oral Daily    Continuous Infusions: . sodium chloride 150 mL/hr at 06/07/14 7829    Past Medical History  Diagnosis Date  . Anemia, iron deficiency   . Hypertension   . Stroke   . Chronic kidney disease     History reviewed. No pertinent past surgical history.  Tilda Franco, MS, PLDN Provisionally Licensed Dietitian Nutritionist Pager: [phone]

## 2014-06-07 NOTE — Progress Notes (Signed)
[facility] PROGRESS NOTE  Caroline Moore EAV:[id number] DOB: 04/17/1956 DOA: 06/06/2014 PCP: No PCP Per Patient  Assessment/Plan:   58 y/o with PMH of HTN, CKD , Recent AKI (02/2014) thought due to NSAID+ACE presented on 8/24 with nausea, vomiting and found to have AKI, UTI at [facility]  1. AKI on CKD (unclear history); Pt reports taking diuretic or ?  lisinopril which was discontinued on last admission -suspected ATN due to hypovolemic (nausea/vomiting) and ACE or diuretic   -Cr improved 6.3-->3.8 on IVF; cont IVF, I/O, urine output ; obtain renal US; again d/w patient not to take ACE;   2. HTN; currently BP is soft; off meds; ACE on hold;  3. UTI: Rocephin x 1 at [facility]; cont atx; pend urine culture 4. Nausea, vomiting prior to admission: resolved;  -abd exam benign; cont supportive care; outpatient follow up, may need endoscopy   Unclear medication history; consult pharmacy evaluation    Code Status: full Family Communication:  D/w patient  (indicate person spoken with, relationship, and if by phone, the number) Disposition Plan: home 24-48 hrs    Consultants:  none  Procedures:  none  Antibiotics:  Ceftriaxone 8/24<<<<   (indicate start date, and stop date if known)  HPI/Subjective: alert  Objective: Filed Vitals:   06/07/14 0838  BP: 97/64  Pulse: 71  Temp:   Resp:     Intake/Output Summary (Last 24 hours) at 06/07/14 0952 Last data filed at 06/07/14 0920  Gross per 24 hour  Intake    240 ml  Output    200 ml  Net     40 ml   Filed Weights   06/06/14 2135  Weight: 45.2 kg (99 lb 10.4 oz)    Exam:   General:  alert  Cardiovascular: s1,s2, rrr  Respiratory: CTA BL  Abdomen: soft, nt,nd   Musculoskeletal: no LE edema   Data Reviewed: Basic Metabolic Panel:  Recent Labs Lab 06/07/14 0715  NA 140  K 4.1  CL 103  CO2 24  GLUCOSE 87  BUN 74*  CREATININE 3.84*  CALCIUM 8.5  MG 2.7*   Liver Function  Tests:  Recent Labs Lab 06/07/14 0715  AST 12  ALT 7  ALKPHOS 70  BILITOT <0.2*  PROT 6.6  ALBUMIN 3.0*   No results found for this basename: LIPASE, AMYLASE,  in the last 168 hours No results found for this basename: AMMONIA,  in the last 168 hours CBC:  Recent Labs Lab 06/07/14 0715  WBC 4.6  NEUTROABS 2.1  HGB 10.7*  HCT 33.3*  MCV 76.2*  PLT 217   Cardiac Enzymes: No results found for this basename: CKTOTAL, CKMB, CKMBINDEX, TROPONINI,  in the last 168 hours BNP (last 3 results) No results found for this basename: PROBNP,  in the last 8760 hours CBG: No results found for this basename: GLUCAP,  in the last 168 hours  No results found for this or any previous visit (from the past 240 hour(s)).   Studies: No results found.  Scheduled Meds: . aspirin EC  81 mg Oral Daily  . heparin  5,000 Units Subcutaneous 3 times per day  . metoCLOPramide  10 mg Oral Daily   Continuous Infusions: . sodium chloride 150 mL/hr at 06/07/14 7829    Active Problems:   AKI (acute kidney injury)    Time spent: >35 minutes     Esperanza Sheets  [facility] Pager [phone]. If 7PM-7AM, please contact night-coverage at [email or web address], password [facility]  06/07/2014, 9:52 AM  LOS: 1 day

## 2014-06-07 NOTE — Progress Notes (Signed)
[facility] faxed over Urine culture. Results: >100,000 Escherichia coli. On call provider notified. No new orders given.

## 2014-06-07 NOTE — Progress Notes (Signed)
Received pt report from [facility].

## 2014-06-08 DIAGNOSIS — E43 Unspecified severe protein-calorie malnutrition: Secondary | ICD-10-CM | POA: Insufficient documentation

## 2014-06-08 LAB — BASIC METABOLIC PANEL
Anion gap: 10 (ref 5–15)
BUN: 39 mg/dL — ABNORMAL HIGH (ref 6–23)
CALCIUM: 8.9 mg/dL (ref 8.4–10.5)
CO2: 23 mEq/L (ref 19–32)
Chloride: 111 mEq/L (ref 96–112)
Creatinine, Ser: 1.91 mg/dL — ABNORMAL HIGH (ref 0.50–1.10)
GFR, EST AFRICAN AMERICAN: 33 mL/min — AB (ref >90)
GFR, EST NON AFRICAN AMERICAN: 28 mL/min — AB (ref >90)
GLUCOSE: 81 mg/dL (ref 70–99)
POTASSIUM: 3.9 meq/L (ref 3.7–5.3)
SODIUM: 144 meq/L (ref 137–147)

## 2014-06-08 LAB — URINE CULTURE: Colony Count: 4000

## 2014-06-08 NOTE — Progress Notes (Signed)
PATIENT DETAILS Name: Caroline Moore Age: 58 y.o. Sex: female Date of Birth: [birth year] Admit Date: 06/06/2014 Admitting Physician Ozella Rocks, MD PCP:No PCP Per Patient  Subjective: Improved, mild nausea. But significantly better.  Assessment/Plan: Active Problems: Acute renal failure - Likely prerenal-secondary to poor oral intake from persistent nausea, and use of lisinopril. - Given that this is second episode of acute renal failure with lisinopril use-up with discontinued this permanently and not rechallenge. - Significantly improved with supportive care IV fluids, continue to follow creatinine. Renal ultrasound on 8/25 negative for hydronephrosis  UTI - Urine culture done at [facility] positive for Escherichia coli that is pansensitive. Currently on day 2 of Rocephin-plan on transitioned to Keflex on discharge. Remains afebrile, nontoxic looking.  History of nausea - No vomiting, tolerating diet. Supportive care.  History of hypertension - Currently blood pressure controlled without the use of any antihypertensive medications. Would not rechallenge with lisinopril in the future. If antihypertensive therapy required, will need agents other than ACEI    Protein-calorie malnutrition, severe - Supplements for nutrition.  History of CVA - Nonfocal exam, continue ASA  Disposition: Remain inpatient  DVT Prophylaxis: Prophylactic Heparin   Code Status: Full code   Family Communication None  Procedures:  None  CONSULTS:  None  Time spent 40 minutes-which includes 50% of the time with face-to-face with patient/ family and coordinating care related to the above assessment and plan.    MEDICATIONS: Scheduled Meds: . aspirin EC  81 mg Oral Daily  . cefTRIAXone (ROCEPHIN)  IV  1 g Intravenous Q24H  . feeding supplement (RESOURCE BREEZE)  1 Container Oral TID WC  . heparin  5,000 Units Subcutaneous 3 times per day  . metoCLOPramide  10 mg Oral  Daily   Continuous Infusions: . sodium chloride 1,000 mL (06/08/14 0958)   PRN Meds:.acetaminophen, acetaminophen, famotidine, ondansetron (ZOFRAN) IV, ondansetron  Antibiotics: Anti-infectives   Start     Dose/Rate Route Frequency Ordered Stop   06/07/14 1100  cefTRIAXone (ROCEPHIN) 1 g in dextrose 5 % 50 mL IVPB     1 g 100 mL/hr over 30 Minutes Intravenous Every 24 hours 06/07/14 1012         PHYSICAL EXAM: Vital signs in last 24 hours: Filed Vitals:   06/07/14 1330 06/07/14 2217 06/08/14 0527 06/08/14 1403  BP: 86/59 105/56 101/71 109/74  Pulse: 74 75 76 74  Temp: 97.7 F (36.5 C) 98.6 F (37 C) 97.7 F (36.5 C) 97.7 F (36.5 C)  TempSrc: Oral Oral Oral Oral  Resp: Height:      Weight:      SpO2: 98% 99% 100% 100%    Weight change:  Filed Weights   06/06/14 2135  Weight: 45.2 kg (99 lb 10.4 oz)   Body mass index is 16.09 kg/(m^2).   Gen Exam: Awake and alert with clear speech.   Neck: Supple, No JVD.   Chest: B/L Clear.   CVS: S1 S2 Regular, no murmurs.  Abdomen: soft, BS +, non tender, non distended.  Extremities: no edema, lower extremities warm to touch. Neurologic: Non Focal.   Skin: No Rash.   Wounds: N/A.   Intake/Output from previous day:  Intake/Output Summary (Last 24 hours) at 06/08/14 1445 Last data filed at 06/08/14 1300  Gross per 24 hour  Intake    802 ml  Output      0 ml  Net  802 ml     LAB RESULTS: CBC  Recent Labs Lab 06/07/14 0715  WBC 4.6  HGB 10.7*  HCT 33.3*  PLT 217  MCV 76.2*  MCH 24.5*  MCHC 32.1  RDW 14.8  LYMPHSABS 1.8  MONOABS 0.4  EOSABS 0.2  BASOSABS 0.0    Chemistries   Recent Labs Lab 06/07/14 0715 06/08/14 0726  NA 140 144  K 4.1 3.9  CL 103 111  CO2 24 23  GLUCOSE 87 81  BUN 74* 39*  CREATININE 3.84* 1.91*  CALCIUM 8.5 8.9  MG 2.7*  --     CBG: No results found for this basename: GLUCAP,  in the last 168 hours  GFR Estimated Creatinine Clearance: 23.2 ml/min  (by C-G formula based on Cr of 1.91).  Coagulation profile No results found for this basename: INR, PROTIME,  in the last 168 hours  Cardiac Enzymes No results found for this basename: CK, CKMB, TROPONINI, MYOGLOBIN,  in the last 168 hours  No components found with this basename: POCBNP,  No results found for this basename: DDIMER,  in the last 72 hours  Recent Labs  06/07/14 0715  HGBA1C 5.7*   No results found for this basename: CHOL, HDL, LDLCALC, TRIG, CHOLHDL, LDLDIRECT,  in the last 72 hours No results found for this basename: TSH, T4TOTAL, FREET3, T3FREE, THYROIDAB,  in the last 72 hours No results found for this basename: VITAMINB12, FOLATE, FERRITIN, TIBC, IRON, RETICCTPCT,  in the last 72 hours No results found for this basename: LIPASE, AMYLASE,  in the last 72 hours  Urine Studies No results found for this basename: UACOL, UAPR, USPG, UPH, UTP, UGL, UKET, UBIL, UHGB, UNIT, UROB, ULEU, UEPI, UWBC, URBC, UBAC, CAST, CRYS, UCOM, BILUA,  in the last 72 hours  MICROBIOLOGY: Recent Results (from the past 240 hour(s))  URINE CULTURE     Status: None   Collection Time    06/07/14  1:36 AM      Result Value Ref Range Status   Specimen Description URINE, RANDOM   Final   Special Requests NONE   Final   Culture  Setup Time     Final   Value: 06/07/2014 08:48     Performed at [facility] Count     Final   Value: 4,000 COLONIES/ML     Performed at [facility]   Culture     Final   Value: INSIGNIFICANT GROWTH     Performed at [facility]   Report Status 06/08/2014 FINAL   Final    RADIOLOGY STUDIES/RESULTS: US Renal  06/07/2014   CLINICAL DATA:  Acute renal insufficiency, UTI, history hypertension and chronic kidney disease  EXAM: RENAL/URINARY TRACT ULTRASOUND COMPLETE  COMPARISON:  01/27/2014  FINDINGS: Right Kidney:  Length: 8.4 cm. Normal cortical thickness. Slightly increased cortical echogenicity. Hypoechoic nodules at mid inferior  pole, measuring 1.4 x 1.2 x 1.1 cm and 1.3 x 1.3 x 1.2 cm. No solid mass, hydronephrosis or shadowing calcification.  Left Kidney:  Length: 10.4 cm. Echogenicity within normal limits. No mass or hydronephrosis visualized.  Bladder:  Appears normal for degree of bladder distention.  IMPRESSION: Small probable LEFT renal cysts, both slightly smaller than on previous study.  No additional mass or hydronephrosis.   Electronically Signed   By: Ulyses Southward M.D.   On: 06/07/2014 11:24    Jeoffrey Massed, MD  [facility] Pager:[phone]  If 7PM-7AM, please contact night-coverage [email or web address] Password [facility] 06/08/2014,  2:45 PM   LOS: 2 days   **Disclaimer: This note may have been dictated with voice recognition software. Similar sounding words can inadvertently be transcribed and this note may contain transcription errors which may not have been corrected upon publication of note.**

## 2014-06-09 LAB — BASIC METABOLIC PANEL
Anion gap: 14 (ref 5–15)
BUN: 19 mg/dL (ref 6–23)
CALCIUM: 8.5 mg/dL (ref 8.4–10.5)
CO2: 21 mEq/L (ref 19–32)
Chloride: 110 mEq/L (ref 96–112)
Creatinine, Ser: 1.29 mg/dL — ABNORMAL HIGH (ref 0.50–1.10)
GFR, EST AFRICAN AMERICAN: 52 mL/min — AB (ref >90)
GFR, EST NON AFRICAN AMERICAN: 45 mL/min — AB (ref >90)
GLUCOSE: 81 mg/dL (ref 70–99)
Potassium: 3.8 mEq/L (ref 3.7–5.3)
Sodium: 145 mEq/L (ref 137–147)

## 2014-06-09 MED ORDER — CEPHALEXIN 500 MG PO CAPS
500.0000 mg | ORAL_CAPSULE | Freq: Two times a day (BID) | ORAL | Status: DC
  Administered 2014-06-09: 500 mg via ORAL
  Filled 2014-06-09 (×2): qty 1

## 2014-06-09 MED ORDER — FAMOTIDINE 20 MG PO TABS: 20.0000 mg | ORAL_TABLET | Freq: Two times a day (BID) | ORAL | Status: AC | PRN

## 2014-06-09 MED ORDER — CEPHALEXIN 500 MG PO CAPS: 500.0000 mg | ORAL_CAPSULE | Freq: Two times a day (BID) | ORAL | Status: DC

## 2014-06-09 MED ORDER — AMLODIPINE BESYLATE 5 MG PO TABS: 5.0000 mg | ORAL_TABLET | Freq: Every day | ORAL | Status: AC

## 2014-06-09 MED ORDER — BOOST / RESOURCE BREEZE PO LIQD: 1.0000 | Freq: Three times a day (TID) | ORAL | Status: AC

## 2014-06-09 NOTE — Progress Notes (Signed)
Patient discharge teaching given, including activity, diet, follow-up appoints, and medications. Patient verbalized understanding of all discharge instructions. IV access was d/c'd. Vitals are stable. Skin is intact except as charted in most recent assessments. Pt to be escorted out by NT, to be driven home by family. 

## 2014-06-09 NOTE — Care Management Note (Signed)
    Page 1 of 1   06/09/2014     12:03:29 PM CARE MANAGEMENT NOTE 06/09/2014  Patient:  Caroline Moore, Caroline Moore   Account Number:  [id number]  Date Initiated:  06/09/2014  Documentation initiated by:  Letha Cape  Subjective/Objective Assessment:   dx dehydration, renal failure  admit- lives with daughter. pta indep.     Action/Plan:   Anticipated DC Date:  06/09/2014   Anticipated DC Plan:  HOME/SELF CARE      DC Planning Services  CM consult      Choice offered to / List presented to:             Status of service:  Completed, signed off Medicare Important Message given?  NO (If response is "NO", the following Medicare IM given date fields will be blank) Date Medicare IM given:   Medicare IM given by:   Date Additional Medicare IM given:   Additional Medicare IM given by:    Discharge Disposition:  HOME/SELF CARE  Per UR Regulation:  Reviewed for med. necessity/level of care/duration of stay  If discussed at Long Length of Stay Meetings, dates discussed:    Comments:  PCP - [facility]  06/09/14 1202 Letha Cape RN, BSN [phone] patient lives with daughter, patient is for dc today, no needs anticipated.

## 2014-06-09 NOTE — Discharge Summary (Signed)
Physician Discharge Summary  Caroline Moore ZOX:[id number] DOB: August 21, 1956 DOA: 06/06/2014  PCP: No PCP Per Patient  Admit date: 06/06/2014 Discharge date: 06/09/2014  Time spent: 45 minutes  Recommendations for Outpatient Follow-up:  1. Check bmet at follow up.  patient with recent renal failure. 2. Lisinopril discontinued permanently. 3. Check BP.  BP medications changed  inpatient.  Lisinopril discontinued.  Amlodipine added.   Discharge Diagnoses:  Active Problems:   AKI (acute kidney injury)   Chronic kidney disease (CKD), stage IV (severe)   HTN (hypertension)   Protein-calorie malnutrition, severe   Discharge Condition: stable.  Diet recommendation: heart healthy with ensure supplementation.   History of present illness:  58 y/o with PMH of HTN, CKD , Recent AKI (02/2014) thought due to NSAID+ACE presented on 8/24 with nausea, vomiting, weight loss and found to have AKI, UTI at [facility] Course:   Acute renal failure  - Etiology is likely prerenal-secondary to poor oral intake from persistent nausea, and use of lisinopril.  - Given that this is the second episode of acute renal failure with lisinopril use, we will discontinue it permanently.  - Significantly improved with supportive care IV fluids, continue to follow creatinine as outpatient - Renal ultrasound on 8/25 negative for hydronephrosis   UTI  - Urine culture done at [facility] positive for Escherichia coli that is pansensitive.  - received 2 days of  Rocephin inpatient.  Will d/c on Keflex x 5 more days. - Remains afebrile, nontoxic looking.   History of nausea  - No vomiting, tolerating diet. Supportive care.   History of hypertension  - Currently blood pressure controlled without the use of any antihypertensive medications.  - Would not rechallenge with lisinopril in the future. If antihypertensive therapy required, will need agents other than ACEI   Protein-calorie  malnutrition, severe  - Supplements for nutrition.   History of CVA  - Nonfocal exam, continue ASA  Procedures:  none  Consultations:  none  Discharge Exam: Filed Vitals:   06/08/14 0527 06/08/14 1403 06/08/14 2118 06/09/14 0507  BP: 101/71 109/74 124/81 122/81  Pulse: 76 74 68 79  Temp: 97.7 F (36.5 C) 97.7 F (36.5 C) 98.4 F (36.9 C) 98.5 F (36.9 C)  TempSrc: Oral Oral Oral Oral  Resp: Height:      Weight:      SpO2: 100% 100% 100% 99%    Filed Weights   06/06/14 2135  Weight: 45.2 kg (99 lb 10.4 oz)     Gen Exam: Awake and alert with clear speech. Sitting up in bed talking on the phone. Neck: Supple, No JVD.  Chest: B/L Clear. No accessory muscle use CVS: S1 S2 Regular, no murmurs.  Abdomen: thin soft, BS +, non tender, non distended. No cva tenderness. Extremities: no edema, lower extremities warm to touch.  Neurologic: Non Focal.   Discharge Instructions      Discharge Instructions   Diet - low sodium heart healthy    Complete by:  As directed      Increase activity slowly    Complete by:  As directed             Medication List    STOP taking these medications       levofloxacin 500 MG tablet  Commonly known as:  LEVAQUIN     lisinopril 20 MG tablet  Commonly known as:  PRINIVIL,ZESTRIL      TAKE these medications  amLODipine 5 MG tablet  Commonly known as:  NORVASC  Take 1 tablet (5 mg total) by mouth daily.     aspirin EC 81 MG tablet  Take 81 mg by mouth daily.     cephALEXin 500 MG capsule  Commonly known as:  KEFLEX  Take 1 capsule (500 mg total) by mouth 2 (two) times daily.     famotidine 20 MG tablet  Commonly known as:  PEPCID  Take 1 tablet (20 mg total) by mouth 2 (two) times daily as needed for heartburn.     feeding supplement (RESOURCE BREEZE) Liqd  Take 1 Container by mouth 3 (three) times daily with meals.       Allergies  Allergen Reactions  . Lisinopril     Acute kidney injury -  resolves off ACE (2nd occurrence)   Follow-up Information   Follow up with No PCP Per Patient.   Specialty:  General Practice      Please follow up. (Please be seen at the [facility] within 7 days for blood work.)        The results of significant diagnostics from this hospitalization (including imaging, microbiology, ancillary and laboratory) are listed below for reference.    Significant Diagnostic Studies: US Renal  06/07/2014   CLINICAL DATA:  Acute renal insufficiency, UTI, history hypertension and chronic kidney disease  EXAM: RENAL/URINARY TRACT ULTRASOUND COMPLETE  COMPARISON:  01/27/2014  FINDINGS: Right Kidney:  Length: 8.4 cm. Normal cortical thickness. Slightly increased cortical echogenicity. Hypoechoic nodules at mid inferior pole, measuring 1.4 x 1.2 x 1.1 cm and 1.3 x 1.3 x 1.2 cm. No solid mass, hydronephrosis or shadowing calcification.  Left Kidney:  Length: 10.4 cm. Echogenicity within normal limits. No mass or hydronephrosis visualized.  Bladder:  Appears normal for degree of bladder distention.  IMPRESSION: Small probable LEFT renal cysts, both slightly smaller than on previous study.  No additional mass or hydronephrosis.   Electronically Signed   By: Ulyses Southward M.D.   On: 06/07/2014 11:24    Microbiology: Recent Results (from the past 240 hour(s))  URINE CULTURE     Status: None   Collection Time    06/07/14  1:36 AM      Result Value Ref Range Status   Specimen Description URINE, RANDOM   Final   Special Requests NONE   Final   Culture  Setup Time     Final   Value: 06/07/2014 08:48     Performed at [facility]   Colony Count     Final   Value: 4,000 COLONIES/ML     Performed at [facility]   Culture     Final   Value: INSIGNIFICANT GROWTH     Performed at [facility]   Report Status 06/08/2014 FINAL   Final     Labs: Basic Metabolic Panel:  Recent Labs Lab 06/07/14 0715 06/08/14 0726 06/09/14 0654  NA 140 144 145   K 4.1 3.9 3.8  CL 103 111 110  CO2 GLUCOSE 87 81 81  BUN 74* 39* 19  CREATININE 3.84* 1.91* 1.29*  CALCIUM 8.5 8.9 8.5  MG 2.7*  --   --    Liver Function Tests:  Recent Labs Lab 06/07/14 0715  AST 12  ALT 7  ALKPHOS 70  BILITOT <0.2*  PROT 6.6  ALBUMIN 3.0*   CBC:  Recent Labs Lab 06/07/14 0715  WBC 4.6  NEUTROABS 2.1  HGB 10.7*  HCT 33.3*  MCV 76.2*  PLT 217    Signed:  Conley Canal [phone]  [facility] 06/09/2014, 11:56 AM   Attending Patient was seen, examined,treatment plan was discussed with the Physician extender. I have directly reviewed the clinical findings, lab, imaging studies and management of this patient in detail. I have made the necessary changes to the above noted documentation, and agree with the documentation, as recorded by the Physician extender.  Windell Norfolk MD [facility].

## 2014-08-06 ENCOUNTER — Observation Stay (HOSPITAL_COMMUNITY)
Admission: EM | Admit: 2014-08-06 | Discharge: 2014-08-07 | Disposition: A | Discharge status: Home / Self Care | Payer: Medicaid Other | Attending: Internal Medicine | Admitting: Internal Medicine

## 2014-08-06 ENCOUNTER — Encounter (HOSPITAL_COMMUNITY): Payer: Self-pay | Admitting: Emergency Medicine

## 2014-08-06 ENCOUNTER — Emergency Department (HOSPITAL_COMMUNITY): Payer: Medicaid Other

## 2014-08-06 DIAGNOSIS — R1031 Right lower quadrant pain: Secondary | ICD-10-CM | POA: Diagnosis present

## 2014-08-06 DIAGNOSIS — Z7982 Long term (current) use of aspirin: Secondary | ICD-10-CM | POA: Insufficient documentation

## 2014-08-06 DIAGNOSIS — Z8673 Personal history of transient ischemic attack (TIA), and cerebral infarction without residual deficits: Secondary | ICD-10-CM | POA: Insufficient documentation

## 2014-08-06 DIAGNOSIS — I10 Essential (primary) hypertension: Secondary | ICD-10-CM

## 2014-08-06 DIAGNOSIS — R634 Abnormal weight loss: Secondary | ICD-10-CM

## 2014-08-06 DIAGNOSIS — N182 Chronic kidney disease, stage 2 (mild): Secondary | ICD-10-CM

## 2014-08-06 DIAGNOSIS — Z681 Body mass index (BMI) 19 or less, adult: Secondary | ICD-10-CM | POA: Diagnosis not present

## 2014-08-06 DIAGNOSIS — E43 Unspecified severe protein-calorie malnutrition: Secondary | ICD-10-CM | POA: Diagnosis not present

## 2014-08-06 DIAGNOSIS — R8299 Other abnormal findings in urine: Secondary | ICD-10-CM

## 2014-08-06 DIAGNOSIS — I129 Hypertensive chronic kidney disease with stage 1 through stage 4 chronic kidney disease, or unspecified chronic kidney disease: Secondary | ICD-10-CM | POA: Diagnosis not present

## 2014-08-06 DIAGNOSIS — K5732 Diverticulitis of large intestine without perforation or abscess without bleeding: Secondary | ICD-10-CM | POA: Diagnosis not present

## 2014-08-06 DIAGNOSIS — E46 Unspecified protein-calorie malnutrition: Secondary | ICD-10-CM

## 2014-08-06 DIAGNOSIS — K5792 Diverticulitis of intestine, part unspecified, without perforation or abscess without bleeding: Secondary | ICD-10-CM

## 2014-08-06 LAB — CBC WITH DIFFERENTIAL/PLATELET
BASOS ABS: 0 10*3/uL (ref 0.0–0.1)
Basophils Relative: 0 % (ref 0–1)
EOS PCT: 0 % (ref 0–5)
Eosinophils Absolute: 0 10*3/uL (ref 0.0–0.7)
HCT: 35.4 % — ABNORMAL LOW (ref 36.0–46.0)
Hemoglobin: 11.5 g/dL — ABNORMAL LOW (ref 12.0–15.0)
LYMPHS ABS: 1 10*3/uL (ref 0.7–4.0)
Lymphocytes Relative: 6 % — ABNORMAL LOW (ref 12–46)
MCH: 24.5 pg — AB (ref 26.0–34.0)
MCHC: 32.5 g/dL (ref 30.0–36.0)
MCV: 75.5 fL — AB (ref 78.0–100.0)
Monocytes Absolute: 0.6 10*3/uL (ref 0.1–1.0)
Monocytes Relative: 4 % (ref 3–12)
Neutro Abs: 15.2 10*3/uL — ABNORMAL HIGH (ref 1.7–7.7)
Neutrophils Relative %: 90 % — ABNORMAL HIGH (ref 43–77)
PLATELETS: 240 10*3/uL (ref 150–400)
RBC: 4.69 MIL/uL (ref 3.87–5.11)
RDW: 13.7 % (ref 11.5–15.5)
WBC: 16.8 10*3/uL — ABNORMAL HIGH (ref 4.0–10.5)

## 2014-08-06 LAB — COMPREHENSIVE METABOLIC PANEL
ALT: 12 U/L (ref 0–35)
AST: 19 U/L (ref 0–37)
Albumin: 3.5 g/dL (ref 3.5–5.2)
Alkaline Phosphatase: 119 U/L — ABNORMAL HIGH (ref 39–117)
Anion gap: 15 (ref 5–15)
BUN: 26 mg/dL — ABNORMAL HIGH (ref 6–23)
CALCIUM: 10.2 mg/dL (ref 8.4–10.5)
CO2: 26 mEq/L (ref 19–32)
Chloride: 98 mEq/L (ref 96–112)
Creatinine, Ser: 1.12 mg/dL — ABNORMAL HIGH (ref 0.50–1.10)
GFR calc non Af Amer: 53 mL/min — ABNORMAL LOW (ref >90)
GFR, EST AFRICAN AMERICAN: 62 mL/min — AB (ref >90)
GLUCOSE: 192 mg/dL — AB (ref 70–99)
Potassium: 3.7 mEq/L (ref 3.7–5.3)
Sodium: 139 mEq/L (ref 137–147)
TOTAL PROTEIN: 9.3 g/dL — AB (ref 6.0–8.3)
Total Bilirubin: 0.4 mg/dL (ref 0.3–1.2)

## 2014-08-06 LAB — URINALYSIS, ROUTINE W REFLEX MICROSCOPIC
Bilirubin Urine: NEGATIVE
GLUCOSE, UA: 500 mg/dL — AB
Ketones, ur: 40 mg/dL — AB
LEUKOCYTES UA: NEGATIVE
Nitrite: POSITIVE — AB
PROTEIN: 100 mg/dL — AB
SPECIFIC GRAVITY, URINE: 1.026 (ref 1.005–1.030)
UROBILINOGEN UA: 1 mg/dL (ref 0.0–1.0)
pH: 5.5 (ref 5.0–8.0)

## 2014-08-06 LAB — HIV ANTIBODY (ROUTINE TESTING W REFLEX): HIV: NONREACTIVE

## 2014-08-06 LAB — URINE MICROSCOPIC-ADD ON

## 2014-08-06 LAB — LIPASE, BLOOD: Lipase: 14 U/L (ref 11–59)

## 2014-08-06 MED ORDER — ENOXAPARIN SODIUM 40 MG/0.4ML ~~LOC~~ SOLN
40.0000 mg | SUBCUTANEOUS | Status: DC
  Administered 2014-08-06: 40 mg via SUBCUTANEOUS
  Filled 2014-08-06: qty 0.4

## 2014-08-06 MED ORDER — SODIUM CHLORIDE 0.9 % IV BOLUS (SEPSIS)
1000.0000 mL | Freq: Once | INTRAVENOUS | Status: AC
  Administered 2014-08-06: 1000 mL via INTRAVENOUS

## 2014-08-06 MED ORDER — BOOST / RESOURCE BREEZE PO LIQD
1.0000 | Freq: Three times a day (TID) | ORAL | Status: DC
  Administered 2014-08-06: 18:00:00 via ORAL
  Administered 2014-08-07 (×2): 1 via ORAL

## 2014-08-06 MED ORDER — ACETAMINOPHEN 325 MG PO TABS
650.0000 mg | ORAL_TABLET | Freq: Four times a day (QID) | ORAL | Status: DC | PRN
Start: 2014-08-06 — End: 2014-08-07

## 2014-08-06 MED ORDER — OXYCODONE-ACETAMINOPHEN 5-325 MG PO TABS: 1.0000 | ORAL_TABLET | Freq: Four times a day (QID) | ORAL | Status: DC | PRN

## 2014-08-06 MED ORDER — METRONIDAZOLE 500 MG PO TABS
500.0000 mg | ORAL_TABLET | Freq: Three times a day (TID) | ORAL | Status: DC
  Administered 2014-08-06 – 2014-08-07 (×3): 500 mg via ORAL
  Filled 2014-08-06 (×6): qty 1

## 2014-08-06 MED ORDER — AMLODIPINE BESYLATE 5 MG PO TABS
5.0000 mg | ORAL_TABLET | Freq: Every day | ORAL | Status: DC
  Administered 2014-08-06 – 2014-08-07 (×2): 5 mg via ORAL
  Filled 2014-08-06 (×2): qty 1

## 2014-08-06 MED ORDER — IOHEXOL 300 MG/ML  SOLN: 25.0000 mL | INTRAMUSCULAR | Status: AC

## 2014-08-06 MED ORDER — ONDANSETRON HCL 4 MG/2ML IJ SOLN
4.0000 mg | Freq: Once | INTRAMUSCULAR | Status: AC
  Administered 2014-08-06: 4 mg via INTRAVENOUS
  Filled 2014-08-06: qty 2

## 2014-08-06 MED ORDER — IOHEXOL 300 MG/ML  SOLN
80.0000 mL | Freq: Once | INTRAMUSCULAR | Status: AC | PRN
  Administered 2014-08-06: 80 mL via INTRAVENOUS

## 2014-08-06 MED ORDER — FAMOTIDINE 20 MG PO TABS: 20.0000 mg | ORAL_TABLET | Freq: Two times a day (BID) | ORAL | Status: DC | PRN

## 2014-08-06 MED ORDER — METRONIDAZOLE IN NACL 5-0.79 MG/ML-% IV SOLN
500.0000 mg | Freq: Once | INTRAVENOUS | Status: AC
  Administered 2014-08-06: 500 mg via INTRAVENOUS
  Filled 2014-08-06: qty 100

## 2014-08-06 MED ORDER — MORPHINE SULFATE 4 MG/ML IJ SOLN
4.0000 mg | Freq: Once | INTRAMUSCULAR | Status: AC
  Administered 2014-08-06: 4 mg via INTRAVENOUS
  Filled 2014-08-06: qty 1

## 2014-08-06 MED ORDER — ACETAMINOPHEN 650 MG RE SUPP: 650.0000 mg | Freq: Four times a day (QID) | RECTAL | Status: DC | PRN

## 2014-08-06 MED ORDER — PROMETHAZINE HCL 25 MG PO TABS: 12.5000 mg | ORAL_TABLET | Freq: Four times a day (QID) | ORAL | Status: DC | PRN

## 2014-08-06 MED ORDER — CIPROFLOXACIN HCL 500 MG PO TABS
500.0000 mg | ORAL_TABLET | Freq: Two times a day (BID) | ORAL | Status: DC
  Administered 2014-08-06 – 2014-08-07 (×3): 500 mg via ORAL
  Filled 2014-08-06 (×5): qty 1

## 2014-08-06 MED ORDER — ENOXAPARIN SODIUM 30 MG/0.3ML ~~LOC~~ SOLN
30.0000 mg | SUBCUTANEOUS | Status: DC
Start: 2014-08-07 — End: 2014-08-07
  Filled 2014-08-06: qty 0.3

## 2014-08-06 MED ORDER — ADULT MULTIVITAMIN W/MINERALS CH
1.0000 | ORAL_TABLET | Freq: Every day | ORAL | Status: DC
  Administered 2014-08-06 – 2014-08-07 (×2): 1 via ORAL
  Filled 2014-08-06 (×2): qty 1

## 2014-08-06 MED ORDER — ASPIRIN EC 81 MG PO TBEC
81.0000 mg | DELAYED_RELEASE_TABLET | Freq: Every day | ORAL | Status: DC
  Administered 2014-08-06 – 2014-08-07 (×2): 81 mg via ORAL
  Filled 2014-08-06 (×2): qty 1

## 2014-08-06 NOTE — ED Provider Notes (Signed)
06:30 AM At end of shift, received hand-off report from GallawayKaitlyn, GeorgiaPA- C.  Pt with RLQ pain and currently drinking contrast for abd/pelvis CT.  If negative will d/c  07:15 AM Pt well appearing and no acute distress, still TTP RLQ  08:20 CT scan shows probable diverticulitis, but cannot rule out appendicitis.    09:40AM Consulted with unassigned internal medicine for admission.  The patient appears reasonably stabilized for admission considering the current resources, flow, and capabilities available in the ED at this time, and I doubt any further screening and/or treatment in the ED prior to admission.        Caroline BattiestElizabeth Isella Slatten, NP 08/09/14 [phone]

## 2014-08-06 NOTE — ED Notes (Signed)
Spoke with CT, patient vomited about 1/3 of the 2nd cup.  Planning to still complete CT at 0640 since patient is a 2 hour drinker.

## 2014-08-06 NOTE — ED Notes (Signed)
Called CT to inform that patient is ready for transport.

## 2014-08-06 NOTE — ED Notes (Signed)
Pt c/o right side lower abdominal pain since yesterday. Pt reports emesis x 8. Pt denies diarrhea, last BM last night and normal. Pt denies dysuria or fevers at home. Family states pt looks weak.

## 2014-08-06 NOTE — ED Notes (Signed)
Kaitlyn, pa-c, at the bedside.  

## 2014-08-06 NOTE — ED Provider Notes (Signed)
CSN: [id number]     Arrival date & time 08/06/14  0143 History   First MD Initiated Contact with Patient 08/06/14 [phone]     Chief Complaint  Patient presents with  . Abdominal Pain     (Consider location/radiation/quality/duration/timing/severity/associated sxs/prior Treatment) HPI Comments: Patient is a 58 year old female with a past medical history of hypertension, previous stroke, CKD, and iron deficiency anemia who presents with a 2 day history of abdominal pain. The pain is located in the RLQ and does not radiate. The pain is described as aching and severe. The pain started gradually and progressively worsened since the onset. No alleviating/aggravating factors. The patient has tried nothing for symptoms without relief. Associated symptoms include nausea and 8 episodes of vomiting. Patient denies fever, headache, diarrhea, chest pain, SOB, dysuria, constipation, abnormal vaginal bleeding/discharge. No history of abdominal surgery. Normal bowel movements.      Past Medical History  Diagnosis Date  . Anemia, iron deficiency   . Hypertension   . Stroke   . Chronic kidney disease    History reviewed. No pertinent past surgical history. Family History  Problem Relation Age of Onset  . CAD Father   . Renal Disease Mother     dilaysis  . Hypertension Mother    History  Substance Use Topics  . Smoking status: Never Smoker   . Smokeless tobacco: Never Used  . Alcohol Use: No   OB History   Grav Para Term Preterm Abortions TAB SAB Ect Mult Living                 Review of Systems  Constitutional: Negative for fever, chills and fatigue.  HENT: Negative for trouble swallowing.   Eyes: Negative for visual disturbance.  Respiratory: Negative for shortness of breath.   Cardiovascular: Negative for chest pain and palpitations.  Gastrointestinal: Positive for nausea, vomiting and abdominal pain. Negative for diarrhea.  Genitourinary: Negative for dysuria and difficulty urinating.   Musculoskeletal: Negative for arthralgias and neck pain.  Skin: Negative for color change.  Neurological: Negative for dizziness and weakness.  Psychiatric/Behavioral: Negative for dysphoric mood.      Allergies  Lisinopril  Home Medications   Prior to Admission medications   Medication Sig Start Date End Date Taking? Authorizing Provider  amLODipine (NORVASC) 5 MG tablet Take 1 tablet (5 mg total) by mouth daily. 06/09/14  Yes Tora KindredMarianne L York, PA-C  aspirin EC 81 MG tablet Take 81 mg by mouth daily.   Yes Historical Provider, MD  famotidine (PEPCID) 20 MG tablet Take 1 tablet (20 mg total) by mouth 2 (two) times daily as needed for heartburn. 06/09/14  Yes Marianne L York, PA-C  feeding supplement, RESOURCE BREEZE, (RESOURCE BREEZE) LIQD Take 1 Container by mouth 3 (three) times daily with meals. 06/09/14  Yes Marianne L York, PA-C   BP 162/119  Pulse 65  Temp(Src) 97.9 F (36.6 C)  Resp 16  Wt 95 lb (43.092 kg)  SpO2 92% Physical Exam  Nursing note and vitals reviewed. Constitutional: She is oriented to person, place, and time. She appears well-developed and well-nourished. No distress.  HENT:  Head: Normocephalic and atraumatic.  Eyes: Conjunctivae and EOM are normal.  Neck: Normal range of motion.  Cardiovascular: Normal rate and regular rhythm.  Exam reveals no gallop and no friction rub.   No murmur heard. Pulmonary/Chest: Effort normal and breath sounds normal. She has no wheezes. She has no rales. She exhibits no tenderness.  Abdominal: Soft. She exhibits  no distension. There is tenderness. There is no rebound.  RLQ tenderness to palpation. No other focal tenderness to palpation or peritoneal signs.   Musculoskeletal: Normal range of motion.  Neurological: She is alert and oriented to person, place, and time. Coordination normal.  Speech is goal-oriented. Moves limbs without ataxia.   Skin: Skin is warm and dry.  Psychiatric: She has a normal mood and affect. Her  behavior is normal.    ED Course  Procedures (including critical care time) Labs Review Labs Reviewed  CBC WITH DIFFERENTIAL - Abnormal; Notable for the following:    WBC 16.8 (*)    Hemoglobin 11.5 (*)    HCT 35.4 (*)    MCV 75.5 (*)    MCH 24.5 (*)    Neutrophils Relative % 90 (*)    Neutro Abs 15.2 (*)    Lymphocytes Relative 6 (*)    All other components within normal limits  COMPREHENSIVE METABOLIC PANEL - Abnormal; Notable for the following:    Glucose, Bld 192 (*)    BUN 26 (*)    Creatinine, Ser 1.12 (*)    Total Protein 9.3 (*)    Alkaline Phosphatase 119 (*)    GFR calc non Af Amer 53 (*)    GFR calc Af Amer 62 (*)    All other components within normal limits  URINALYSIS, ROUTINE W REFLEX MICROSCOPIC - Abnormal; Notable for the following:    APPearance CLOUDY (*)    Glucose, UA 500 (*)    Hgb urine dipstick TRACE (*)    Ketones, ur 40 (*)    Protein, ur 100 (*)    Nitrite POSITIVE (*)    All other components within normal limits  URINE MICROSCOPIC-ADD ON - Abnormal; Notable for the following:    Bacteria, UA MANY (*)    All other components within normal limits  LIPASE, BLOOD  HIV ANTIBODY (ROUTINE TESTING)  BASIC METABOLIC PANEL  CBC    Imaging Review Ct Abdomen Pelvis W Contrast  08/06/2014   CLINICAL DATA:  Acute right lower quadrant pain.  EXAM: CT ABDOMEN AND PELVIS WITH CONTRAST  TECHNIQUE: Multidetector CT imaging of the abdomen and pelvis was performed using the standard protocol following bolus administration of intravenous contrast.  CONTRAST:  70mL OMNIPAQUE IOHEXOL 300 MG/ML  SOLN  COMPARISON:  None.  FINDINGS: There are inflammatory changes surrounding the cecum and lower ascending colon. No normal appendix is visualized. There are multiple colonic diverticula, with diverticula noted along the cecum and ascending colon. Several surgical vascular clips lie along the posterior margin of the cecum anterior to the right psoas muscle. Patient does not  report previous surgery. In etiology of these is unclear. Overall, the findings are most suggestive of right colon diverticulitis. Appendicitis is not excluded. No abscess or extraluminal air.  Numerous other uninflamed diverticula extend throughout the colon. There are no other areas of colonic inflammation. Stool mild to moderately distends the rectum.  Normal small bowel.  Lung bases essentially clear.  Heart is normal in size.  Sub cm low-density right lobe liver lesion most consistent with cysts. Liver otherwise unremarkable.  Spleen, gallbladder, pancreas, adrenal glands:  Normal.  There are low-density renal lesions, mostly subcentimeter. Largest is on the right at the midpole measuring 2 cm. This is a cyst. The other lesions are too small to fully characterize but likely cysts. There is mild right hydronephrosis and dilation of the proximal and mid right ureter. This low grade partial obstruction appears to  be from the inflammatory change in the right lower quadrant affecting the mid to distal right ureter. No renal or ureteral stones.  Bladder is only mildly distended. Wall appears mildly thickened. No bladder mass or stone.  Uterus is deviated to the right. There are no adnexal masses. Bilateral tubal ligation clips are noted.  No pathologically enlarged lymph nodes. There is no generalized ascites.  There are degenerative changes of the lower lumbar spine. No osteoblastic or osteolytic lesions.  IMPRESSION: 1. Inflammatory changes are centered about the cecum and lower ascending colon. There are numerous cecal and right colon diverticula. Findings are most suggestive of right colon diverticulitis. However, no normal appendix is seen. Appendicitis is not excluded. No abscess or extraluminal air. 2. Inflammatory changes in the right lower quadrant the 2 mild dilation of the right intrarenal collecting system and proximal right ureter. 3. No other acute findings.   Electronically Signed   By: Amie Portlandavid  Ormond  M.D.   On: 08/06/2014 07:50     EKG Interpretation None      MDM   Final diagnoses:  Abdominal pain, RLQ    3:52 AM Labs show elevated WBC. Vitals stable and patient afebrile. Patient will have fluids, morphine and zofran.   Urinalysis shows UTI. CT abdomen pelvis pending. Patient signed out to Harle BattiestElizabeth Tysinger, NP for CT results and disposition.     Emilia BeckKaitlyn Lan Mcneill, PA-C 08/06/14 2245

## 2014-08-06 NOTE — H&P (Signed)
Date: 08/06/2014               Patient Name:  Caroline Moore MRN: [id number]  DOB: 1956-01-06 Age / Sex: 58 y.o., female   PCP: No Pcp Per Patient         Medical Service: Internal Medicine Teaching Service         Attending Physician: Dr. Inez CatalinaEmily B Mullen, MD    First Contact: Dr. Gara Kroneriana Truong Pager: [phone]  Second Contact: Dr. Carlynn PurlErik Hoffman Pager: [phone]       After Hours (After 5p/  First Contact Pager: [phone]  weekends / holidays): Second Contact Pager: [phone]   Chief Complaint: stomach pain  History of Present Illness: Pt is a 58 y/o female w/ PMHx of HTN, Fe deficiency anemia, and CKD who presents with RLQ pain x 1 day. Pt was laying down when RLQ pain started, pain has progressively worsened, and has remained localized to RLQ. Currently pt rates pain as 3/10 after getting IV morphine. Pt has not tried anything for pain, pain is aggravated by movement. Pt has been nauseous and dry heaving w/ no vomiting. Denies fevers, NS, chills. She has never had a colonoscopy before. Only surgery pt has had was tubal ligation. Per chart review pt has had intermittent nausea since March after getting 8 teeth removed. She has also since then lost 20lbs 2/2 decreased appetite. Denies family hx of cancer. She states her mood is stable and does not have any difficulty or pain w/ eating.   Meds: Current Facility-Administered Medications  Medication Dose Route Frequency Provider Last Rate Last Dose  . amLODipine (NORVASC) tablet 5 mg  5 mg Oral Daily Gara Kroneriana Truong, MD   5 mg at 08/06/14 1247   Current Outpatient Prescriptions  Medication Sig Dispense Refill  . amLODipine (NORVASC) 5 MG tablet Take 1 tablet (5 mg total) by mouth daily.  30 tablet  0  . aspirin EC 81 MG tablet Take 81 mg by mouth daily.      . famotidine (PEPCID) 20 MG tablet Take 1 tablet (20 mg total) by mouth 2 (two) times daily as needed for heartburn.      . feeding supplement, RESOURCE BREEZE, (RESOURCE BREEZE) LIQD Take 1  Container by mouth 3 (three) times daily with meals.  90 Container  2    Allergies: Allergies as of 08/06/2014 - Review Complete 08/06/2014  Allergen Reaction Noted  . Lisinopril  06/08/2014   Past Medical History  Diagnosis Date  . Anemia, iron deficiency   . Hypertension   . Stroke   . Chronic kidney disease    Past Surgical History  Procedure Laterality Date  . Tubal ligation      at age 58   Family History  Problem Relation Age of Onset  . CAD Father   . Renal Disease Mother     dilaysis  . Hypertension Mother    History   Social History  . Marital Status: Single    Spouse Name: N/A    Number of Children: N/A  . Years of Education: N/A   Occupational History  . disability    Social History Main Topics  . Smoking status: Never Smoker   . Smokeless tobacco: Never Used  . Alcohol Use: No  . Drug Use: 3.00 per week    Special: Marijuana  . Sexual Activity: Yes   Other Topics Concern  . Not on file   Social History Narrative  . No narrative on file  Review of Systems: General: denies NS, chills, fevers, decreased appetite, unintentional weight loss HEENT: negative for facial pain and ulcers. Denies dizziness GI: positive for nausea and dry heaves. Neg for diarrhea or hematochezia.  GU: negative for dysuria, hesitancy, and frequency Neuro: positive for stable mood.     Physical Exam: Blood pressure 175/107, pulse 98, temperature 97.8 F (36.6 C), resp. rate 16, weight 95 lb (43.092 kg), SpO2 100.00%. BP 175/104  Pulse 98  Temp(Src) 97.6 F (36.4 C) (Oral)  Resp 15  Ht 5\' 6"  (1.676 m)  Wt 95 lb (43.092 kg)  BMI 15.34 kg/m2  SpO2 99%  General Appearance:    Alert, cooperative, no distress, cachetic  Eyes:    PERRL, conjunctiva/corneas clear, EOM's intact  Throat:   Lips, mucosa, and tongue normal; missing upper teeth, gums pink w/o ulcers  Lungs:     Clear to auscultation bilaterally, respirations unlabored   Heart:    Regular rate and  rhythm, S1 and S2 normal, no murmur, rub   or gallop  Abdomen:     Soft, tenderness to palpation at mcburney's point, active bowel sounds  Extremities:   Extremities normal, atraumatic, no cyanosis or edema  Neurologic:   CNII-XII grossly intact     Lab results: Basic Metabolic Panel:  Recent Labs  16/07/9609/24/15 0310  NA 139  K 3.7  CL 98  CO2 26  GLUCOSE 192*  BUN 26*  CREATININE 1.12*  CALCIUM 10.2   Liver Function Tests:  Recent Labs  08/06/14 0310  AST 19  ALT 12  ALKPHOS 119*  BILITOT 0.4  PROT 9.3*  ALBUMIN 3.5    Recent Labs  08/06/14 0310  LIPASE 14   CBC:  Recent Labs  08/06/14 0310  WBC 16.8*  NEUTROABS 15.2*  HGB 11.5*  HCT 35.4*  MCV 75.5*  PLT 240   Urinalysis:  Recent Labs  08/06/14 0419  COLORURINE YELLOW  LABSPEC 1.026  PHURINE 5.5  GLUCOSEU 500*  HGBUR TRACE*  BILIRUBINUR NEGATIVE  KETONESUR 40*  PROTEINUR 100*  UROBILINOGEN 1.0  NITRITE POSITIVE*  LEUKOCYTESUR NEGATIVE    Imaging results:  Ct Abdomen Pelvis W Contrast  08/06/2014   CLINICAL DATA:  Acute right lower quadrant pain.  EXAM: CT ABDOMEN AND PELVIS WITH CONTRAST  TECHNIQUE: Multidetector CT imaging of the abdomen and pelvis was performed using the standard protocol following bolus administration of intravenous contrast.  CONTRAST:  70mL OMNIPAQUE IOHEXOL 300 MG/ML  SOLN  COMPARISON:  None.  FINDINGS: There are inflammatory changes surrounding the cecum and lower ascending colon. No normal appendix is visualized. There are multiple colonic diverticula, with diverticula noted along the cecum and ascending colon. Several surgical vascular clips lie along the posterior margin of the cecum anterior to the right psoas muscle. Patient does not report previous surgery. In etiology of these is unclear. Overall, the findings are most suggestive of right colon diverticulitis. Appendicitis is not excluded. No abscess or extraluminal air.  Numerous other uninflamed diverticula extend  throughout the colon. There are no other areas of colonic inflammation. Stool mild to moderately distends the rectum.  Normal small bowel.  Lung bases essentially clear.  Heart is normal in size.  Sub cm low-density right lobe liver lesion most consistent with cysts. Liver otherwise unremarkable.  Spleen, gallbladder, pancreas, adrenal glands:  Normal.  There are low-density renal lesions, mostly subcentimeter. Largest is on the right at the midpole measuring 2 cm. This is a cyst. The other lesions are too small  to fully characterize but likely cysts. There is mild right hydronephrosis and dilation of the proximal and mid right ureter. This low grade partial obstruction appears to be from the inflammatory change in the right lower quadrant affecting the mid to distal right ureter. No renal or ureteral stones.  Bladder is only mildly distended. Wall appears mildly thickened. No bladder mass or stone.  Uterus is deviated to the right. There are no adnexal masses. Bilateral tubal ligation clips are noted.  No pathologically enlarged lymph nodes. There is no generalized ascites.  There are degenerative changes of the lower lumbar spine. No osteoblastic or osteolytic lesions.  IMPRESSION: 1. Inflammatory changes are centered about the cecum and lower ascending colon. There are numerous cecal and right colon diverticula. Findings are most suggestive of right colon diverticulitis. However, no normal appendix is seen. Appendicitis is not excluded. No abscess or extraluminal air. 2. Inflammatory changes in the right lower quadrant the 2 mild dilation of the right intrarenal collecting system and proximal right ureter. 3. No other acute findings.   Electronically Signed   By: Amie Portland M.D.   On: 08/06/2014 07:50    Assessment & Plan by Problem: Principal Problem:   Acute diverticulitis Active Problems:   Chronic kidney disease (CKD), stage II (mild)   HTN (hypertension)   Protein-calorie malnutrition, severe    Diverticulitis  Acute diverticulitis-- CT of abd revealed rt colon diverticulitis. WBC 16.8. Pt afebrile. Appendicitis could not be ruled out on CT and pt had tenderness at McBurney's point. Pt received one dose of IV flagyl in the ED.  - PO flagyl 500mg  TID and cipro 500mg  BID x 10 days - will check CBC tomorrow morning for resolution of leukocytosis  - phenergan for nausea - outpatient colonoscopy 6-8 weeks after diverticulitis resolves - percocet q6h prn for pain  HTN- - did not take her bp meds yesterday b/c she was not feeling well. BP elevated at 162/119 likely due to missing her dose of medications yesterday and this morning as well as due to pain.  - continue home med norvasc 5mg  qd  Malnutrition - HIV - resource breeze supplements - nutrition consult  CKD 2: Cr and GFR improved from her previous admissions at 1.12 and 62 respectively. Will monitor.   DVT prophylaxis:  - lovenox  Dispo: Disposition is deferred at this time, awaiting improvement of current medical problems. Anticipated discharge in approximately 1-2 day(s).   The patient does have a current PCP (No Pcp Per Patient) and does not need an [facility] hospital follow-up appointment after discharge.  The patient does not have transportation limitations that hinder transportation to clinic appointments.  Signed: Gara Kroner, MD 08/06/2014, 1:08 PM

## 2014-08-06 NOTE — ED Notes (Signed)
Patient reports she vomited the contrast.

## 2014-08-07 LAB — BASIC METABOLIC PANEL
ANION GAP: 16 — AB (ref 5–15)
BUN: 17 mg/dL (ref 6–23)
CALCIUM: 10.3 mg/dL (ref 8.4–10.5)
CO2: 26 meq/L (ref 19–32)
Chloride: 96 mEq/L (ref 96–112)
Creatinine, Ser: 1.09 mg/dL (ref 0.50–1.10)
GFR calc Af Amer: 64 mL/min — ABNORMAL LOW (ref >90)
GFR calc non Af Amer: 55 mL/min — ABNORMAL LOW (ref >90)
Glucose, Bld: 111 mg/dL — ABNORMAL HIGH (ref 70–99)
Potassium: 3.6 mEq/L — ABNORMAL LOW (ref 3.7–5.3)
Sodium: 138 mEq/L (ref 137–147)

## 2014-08-07 LAB — CBC
HCT: 34.9 % — ABNORMAL LOW (ref 36.0–46.0)
Hemoglobin: 11.6 g/dL — ABNORMAL LOW (ref 12.0–15.0)
MCH: 24 pg — ABNORMAL LOW (ref 26.0–34.0)
MCHC: 33.2 g/dL (ref 30.0–36.0)
MCV: 72.1 fL — ABNORMAL LOW (ref 78.0–100.0)
PLATELETS: 248 10*3/uL (ref 150–400)
RBC: 4.84 MIL/uL (ref 3.87–5.11)
RDW: 13.1 % (ref 11.5–15.5)
WBC: 14.6 10*3/uL — ABNORMAL HIGH (ref 4.0–10.5)

## 2014-08-07 MED ORDER — ADULT MULTIVITAMIN W/MINERALS CH: 1.0000 | ORAL_TABLET | Freq: Every day | ORAL | Status: AC

## 2014-08-07 MED ORDER — METRONIDAZOLE 500 MG PO TABS
500.0000 mg | ORAL_TABLET | Freq: Three times a day (TID) | ORAL | Status: DC
  Administered 2014-08-07: 500 mg via ORAL

## 2014-08-07 MED ORDER — METRONIDAZOLE 500 MG PO TABS: ORAL_TABLET | ORAL | Status: AC

## 2014-08-07 MED ORDER — CIPROFLOXACIN HCL 500 MG PO TABS: ORAL_TABLET | ORAL | Status: AC

## 2014-08-07 NOTE — Discharge Summary (Signed)
Name: Caroline Moore MRN: [id number] DOB: July 19, 1956 58 y.o. PCP: No Pcp Per Patient  Date of Admission: 08/06/2014  2:59 AM Date of Discharge: 08/07/2014 Attending Physician: Inez Catalina, MD  Discharge Diagnosis: Principal Problem:   Acute diverticulitis Active Problems:   Chronic kidney disease (CKD), stage II (mild)   HTN (hypertension)   Protein-calorie malnutrition, severe   Diverticulitis  Discharge Medications:   Medication List    ASK your doctor about these medications       amLODipine 5 MG tablet  Commonly known as:  NORVASC  Take 1 tablet (5 mg total) by mouth daily.     aspirin EC 81 MG tablet  Take 81 mg by mouth daily.     famotidine 20 MG tablet  Commonly known as:  PEPCID  Take 1 tablet (20 mg total) by mouth 2 (two) times daily as needed for heartburn.     feeding supplement (RESOURCE BREEZE) Liqd  Take 1 Container by mouth 3 (three) times daily with meals.        Disposition and follow-up:   Ms.Lynsay Blanda was discharged from [facility] in Fair condition.  At the hospital follow up visit please address:  1.  Due to significant weight loss please ensure patient is up to date on her yearly screening examinations. She will need a colonoscopy 6-8 weeks after resolution of diverticulitis  2.  Labs / imaging needed at time of follow-up:CBC to check WBCs and BMET to check potassium   3.  Pending labs/ test needing follow-up: none  Procedures Performed:  Ct Abdomen Pelvis W Contrast  08/06/2014   CLINICAL DATA:  Acute right lower quadrant pain.  EXAM: CT ABDOMEN AND PELVIS WITH CONTRAST  TECHNIQUE: Multidetector CT imaging of the abdomen and pelvis was performed using the standard protocol following bolus administration of intravenous contrast.  CONTRAST:  70mL OMNIPAQUE IOHEXOL 300 MG/ML  SOLN  COMPARISON:  None.  FINDINGS: There are inflammatory changes surrounding the cecum and lower ascending colon. No normal appendix is  visualized. There are multiple colonic diverticula, with diverticula noted along the cecum and ascending colon. Several surgical vascular clips lie along the posterior margin of the cecum anterior to the right psoas muscle. Patient does not report previous surgery. In etiology of these is unclear. Overall, the findings are most suggestive of right colon diverticulitis. Appendicitis is not excluded. No abscess or extraluminal air.  Numerous other uninflamed diverticula extend throughout the colon. There are no other areas of colonic inflammation. Stool mild to moderately distends the rectum.  Normal small bowel.  Lung bases essentially clear.  Heart is normal in size.  Sub cm low-density right lobe liver lesion most consistent with cysts. Liver otherwise unremarkable.  Spleen, gallbladder, pancreas, adrenal glands:  Normal.  There are low-density renal lesions, mostly subcentimeter. Largest is on the right at the midpole measuring 2 cm. This is a cyst. The other lesions are too small to fully characterize but likely cysts. There is mild right hydronephrosis and dilation of the proximal and mid right ureter. This low grade partial obstruction appears to be from the inflammatory change in the right lower quadrant affecting the mid to distal right ureter. No renal or ureteral stones.  Bladder is only mildly distended. Wall appears mildly thickened. No bladder mass or stone.  Uterus is deviated to the right. There are no adnexal masses. Bilateral tubal ligation clips are noted.  No pathologically enlarged lymph nodes. There is no generalized ascites.  There are degenerative changes of the lower lumbar spine. No osteoblastic or osteolytic lesions.  IMPRESSION: 1. Inflammatory changes are centered about the cecum and lower ascending colon. There are numerous cecal and right colon diverticula. Findings are most suggestive of right colon diverticulitis. However, no normal appendix is seen. Appendicitis is not excluded. No  abscess or extraluminal air. 2. Inflammatory changes in the right lower quadrant the 2 mild dilation of the right intrarenal collecting system and proximal right ureter. 3. No other acute findings.   Electronically Signed   By: Amie Portlandavid  Ormond M.D.   On: 08/06/2014 07:50    Admission HPI: Pt is a 58 y/o female w/ PMHx of HTN, Fe deficiency anemia, and CKD who presents with RLQ pain x 1 day. Pt was laying down when RLQ pain started, pain has progressively worsened, and has remained localized to RLQ. Currently pt rates pain as 3/10 after getting IV morphine. Pt has not tried anything for pain, pain is aggravated by movement. Pt has been nauseous and dry heaving w/ no vomiting. Denies fevers, NS, chills. She has never had a colonoscopy before. Only surgery pt has had was tubal ligation. Per chart review pt has had intermittent nausea since March after getting 8 teeth removed. She has also since then lost 20lbs 2/2 decreased appetite. Denies family hx of cancer. She states her mood is stable and does not have any difficulty or pain w/ eating.   Hospital Course by problem list: Principal Problem:   Acute diverticulitis Active Problems:   Chronic kidney disease (CKD), stage II (mild)   HTN (hypertension)   Protein-calorie malnutrition, severe   Diverticulitis   Acute diverticulitis-- CT of abd revealed rt colon diverticulitis. Started on flagyl 500mg  TID and cipro 500mg  BID  x 10 days with significant improvement in symptoms the next day. Will need outpatient colonoscopy 6-8 weeks after diverticulitis resolves   HTN- - BP elevated on admission likely due to missing her dose of medications and due to pain. Continued home med norvasc 5mg  and blood pressure was well controlled on day of discharge  Malnutrition- pt reports intermittent nausea since March after teeth extraction. HIV negative. On day of discharge pt regained her appetite and did not require nausea meds.   Discharge Vitals:   BP 111/82  Pulse  113  Temp(Src) 98.3 F (36.8 C) (Oral)  Resp 16  Ht 5\' 6"  (1.676 m)  Wt 95 lb (43.092 kg)  BMI 15.34 kg/m2  SpO2 98%  Discharge Labs:  Results for orders placed during the hospital encounter of 08/06/14 (from the past 24 hour(s))  HIV ANTIBODY (ROUTINE TESTING)     Status: None   Collection Time    08/06/14  3:08 PM      Result Value Ref Range   HIV 1&2 Ab, 4th Generation NONREACTIVE  NONREACTIVE  BASIC METABOLIC PANEL     Status: Abnormal   Collection Time    08/07/14  3:45 AM      Result Value Ref Range   Sodium 138  137 - 147 mEq/L   Potassium 3.6 (*) 3.7 - 5.3 mEq/L   Chloride 96  96 - 112 mEq/L   CO2 26  19 - 32 mEq/L   Glucose, Bld 111 (*) 70 - 99 mg/dL   BUN 17  6 - 23 mg/dL   Creatinine, Ser 1.611.09  0.50 - 1.10 mg/dL   Calcium 09.610.3  8.4 - 04.510.5 mg/dL   GFR calc non Af Amer 55 (*) >90  mL/min   GFR calc Af Amer 64 (*) >90 mL/min   Anion gap 16 (*) 5 - 15  CBC     Status: Abnormal   Collection Time    08/07/14  3:45 AM      Result Value Ref Range   WBC 14.6 (*) 4.0 - 10.5 K/uL   RBC 4.84  3.87 - 5.11 MIL/uL   Hemoglobin 11.6 (*) 12.0 - 15.0 g/dL   HCT 96.034.9 (*) 45.436.0 - 09.846.0 %   MCV 72.1 (*) 78.0 - 100.0 fL   MCH 24.0 (*) 26.0 - 34.0 pg   MCHC 33.2  30.0 - 36.0 g/dL   RDW 11.913.1  14.711.5 - 82.915.5 %   Platelets 248  150 - 400 K/uL    Signed: Gara Kroneriana Truong, MD 08/07/2014, 10:37 AM    Services Ordered on Discharge: none Equipment Ordered on Discharge: none

## 2014-08-07 NOTE — Progress Notes (Signed)
Subjective: Pt feels better and has regained her appetite. She has seen nutrition in the past for diet education.   Objective: Vital signs in last 24 hours: Filed Vitals:   08/06/14 1333 08/06/14 1433 08/06/14 2146 08/07/14 0527  BP: 175/104 154/97 135/94 111/82  Pulse: 98 103 103 113  Temp: 97.6 F (36.4 C)  98.3 F (36.8 C) 98.3 F (36.8 C)  TempSrc: Oral  Oral Oral  Resp: 15 18 16 16   Height: 5\' 6"  (1.676 m)     Weight: 95 lb (43.092 kg)     SpO2: 99%  98% 98%   PE General Appearance:  Alert, cooperative, no distress, cachetic, eating breakfast  Lungs:  Clear to auscultation bilaterally, respirations unlabored   Heart:  Regular rate and rhythm, S1 and S2 normal, no murmur, rub or gallop   Abdomen:  Soft, tenderness to palpation at mcburney's point, active bowel sounds   Extremities:  Extremities normal, atraumatic, no cyanosis or edema   Neurologic:  CNII-XII grossly intact      Lab Results: Basic Metabolic Panel:  Recent Labs Lab 08/06/14 0310 08/07/14 0345  NA 139 138  K 3.7 3.6*  CL 98 96  CO2 26 26  GLUCOSE 192* 111*  BUN 26* 17  CREATININE 1.12* 1.09  CALCIUM 10.2 10.3   Liver Function Tests:  Recent Labs Lab 08/06/14 0310  AST 19  ALT 12  ALKPHOS 119*  BILITOT 0.4  PROT 9.3*  ALBUMIN 3.5    Recent Labs Lab 08/06/14 0310  LIPASE 14   CBC:  Recent Labs Lab 08/06/14 0310 08/07/14 0345  WBC 16.8* 14.6*  NEUTROABS 15.2*  --   HGB 11.5* 11.6*  HCT 35.4* 34.9*  MCV 75.5* 72.1*  PLT 240 248   Urinalysis:  Recent Labs Lab 08/06/14 0419  COLORURINE YELLOW  LABSPEC 1.026  PHURINE 5.5  GLUCOSEU 500*  HGBUR TRACE*  BILIRUBINUR NEGATIVE  KETONESUR 40*  PROTEINUR 100*  UROBILINOGEN 1.0  NITRITE POSITIVE*  LEUKOCYTESUR NEGATIVE   Studies/Results: Ct Abdomen Pelvis W Contrast  08/06/2014   CLINICAL DATA:  Acute right lower quadrant pain.  EXAM: CT ABDOMEN AND PELVIS WITH CONTRAST  TECHNIQUE: Multidetector CT imaging of the  abdomen and pelvis was performed using the standard protocol following bolus administration of intravenous contrast.  CONTRAST:  70mL OMNIPAQUE IOHEXOL 300 MG/ML  SOLN  COMPARISON:  None.  FINDINGS: There are inflammatory changes surrounding the cecum and lower ascending colon. No normal appendix is visualized. There are multiple colonic diverticula, with diverticula noted along the cecum and ascending colon. Several surgical vascular clips lie along the posterior margin of the cecum anterior to the right psoas muscle. Patient does not report previous surgery. In etiology of these is unclear. Overall, the findings are most suggestive of right colon diverticulitis. Appendicitis is not excluded. No abscess or extraluminal air.  Numerous other uninflamed diverticula extend throughout the colon. There are no other areas of colonic inflammation. Stool mild to moderately distends the rectum.  Normal small bowel.  Lung bases essentially clear.  Heart is normal in size.  Sub cm low-density right lobe liver lesion most consistent with cysts. Liver otherwise unremarkable.  Spleen, gallbladder, pancreas, adrenal glands:  Normal.  There are low-density renal lesions, mostly subcentimeter. Largest is on the right at the midpole measuring 2 cm. This is a cyst. The other lesions are too small to fully characterize but likely cysts. There is mild right hydronephrosis and dilation of the proximal and mid right  ureter. This low grade partial obstruction appears to be from the inflammatory change in the right lower quadrant affecting the mid to distal right ureter. No renal or ureteral stones.  Bladder is only mildly distended. Wall appears mildly thickened. No bladder mass or stone.  Uterus is deviated to the right. There are no adnexal masses. Bilateral tubal ligation clips are noted.  No pathologically enlarged lymph nodes. There is no generalized ascites.  There are degenerative changes of the lower lumbar spine. No osteoblastic  or osteolytic lesions.  IMPRESSION: 1. Inflammatory changes are centered about the cecum and lower ascending colon. There are numerous cecal and right colon diverticula. Findings are most suggestive of right colon diverticulitis. However, no normal appendix is seen. Appendicitis is not excluded. No abscess or extraluminal air. 2. Inflammatory changes in the right lower quadrant the 2 mild dilation of the right intrarenal collecting system and proximal right ureter. 3. No other acute findings.   Electronically Signed   By: Amie Portlandavid  Ormond M.D.   On: 08/06/2014 07:50   Medications: I have reviewed the patient's current medications. Scheduled Meds: . amLODipine  5 mg Oral Daily  . aspirin EC  81 mg Oral Daily  . ciprofloxacin  500 mg Oral BID  . enoxaparin (LOVENOX) injection  30 mg Subcutaneous Q24H  . feeding supplement (RESOURCE BREEZE)  1 Container Oral TID WC  . metroNIDAZOLE  500 mg Oral 3 times per day  . multivitamin with minerals  1 tablet Oral Daily   Continuous Infusions:  PRN Meds:.acetaminophen, acetaminophen, famotidine, oxyCODONE-acetaminophen, promethazine Assessment/Plan: Principal Problem:   Acute diverticulitis Active Problems:   Chronic kidney disease (CKD), stage II (mild)   HTN (hypertension)   Protein-calorie malnutrition, severe   Diverticulitis   Acute diverticulitis-- CT of abd revealed rt colon diverticulitis. - PO flagyl 500mg  TID and cipro 500mg  BID x 10 days  - WBC trending down, pt was afebrile overnight, feeling much better with improvement of appetite. D/c home today after lunch - phenergan for nausea  - outpatient colonoscopy 6-8 weeks after diverticulitis resolves  - percocet q6h prn for pain, did not require any overnight   HTN- - did not take her bp meds yesterday b/c she was not feeling well. BP controlled overnight - continue home med norvasc 5mg  qd   Malnutrition  - HIV negative - resource breeze supplements  - nutrition consulted  CKD 2: Cr  and GFR improved from her previous admissions at 1.12 and 62 respectively. Will monitor.  - Cr 1.09, will continue to monitor  DVT prophylaxis:  - lovenox   Dispo: Disposition is deferred at this time, awaiting improvement of current medical problems. Anticipated discharge in approximately 1-2 day(s).   The patient does have a current PCP [facility] and [facility] after discharge.   The patient does not have transportation limitations that hinder transportation to clinic appointments.   LOS: 1 day   Gara Kroneriana Marabelle Cushman, MD 08/07/2014, 7:07 AM

## 2014-08-07 NOTE — H&P (Signed)
  Date: 08/07/2014  Patient name: Caroline ChristineGail Crosby  Medical record number: [id number]  Date of birth: 04/17/1956   I have seen and evaluated Caroline Moore and discussed their care with the Residency Team. Ms. Caroline Moore is a 58yo woman who presented with RLQ pain and constipation.  She had a Ct scan of the abdomen which showed diverticulitis and could not rule out appendicitis.  She noted the pain was improved with IV morphine.  She has been able to take her antibiotics PO.  Further concerning history includes 20 pounds weight loss.  She reports never having been screened for cervical cancer.  She is UTD on MMG.  She will require a colonoscopy after her diverticulitis improves.    Assessment and Plan: I have seen and evaluated the patient as outlined above. I agree with the formulated Assessment and Plan as detailed in the residents' admission note, with the following changes:   1. Diverticulitis - PO Flagyl and ciprofloxacin - Can have an early discharge, but would give warning signs of when to return as appendicitis cannot be ruled out - Oral pain control - Advance diet as tolerated  2. Nitrate + UA - She will be on ciprofloxacin, however, she is asymptomatic  3. Weight loss - Would encourage adequate cancer screening and follow up with PCP on discharge.  - Check HIV  Inez CatalinaEmily B Mullen, MD [id number]/25/201511:13 AM

## 2014-08-07 NOTE — Progress Notes (Addendum)
Discharge home. Discharge instruction given, no questions verbalized. Dc'd at 1700

## 2014-08-07 NOTE — Discharge Instructions (Signed)
Start taking flagyl once tonight and then three times a day for 8 more days.   Start taking ciprofloxacin once tonight and then twice a day for 8 more days.  You can start taking a multivitamin once a day.   You will need a colonoscopy in 6-8 weeks after your diverticulitis gets  better.   If you start having nausea, persistent vomiting, and uncontrolled abdominal pain call your PCP or go to the ED.    Diverticulitis Diverticulitis is when small pockets that have formed in your colon (large intestine) become infected or swollen. HOME CARE  Follow your doctor's instructions.  Follow a special diet if told by your doctor.  When you feel better, your doctor may tell you to change your diet. You may be told to eat a lot of fiber. Fruits and vegetables are good sources of fiber. Fiber makes it easier to poop (have bowel movements).  Take supplements or probiotics as told by your doctor.  Only take medicines as told by your doctor.  Keep all follow-up visits with your doctor. GET HELP IF:  Your pain does not get better.  You have a hard time eating food.  You are not pooping like normal. GET HELP RIGHT AWAY IF:  Your pain gets worse.  Your problems do not get better.  Your problems suddenly get worse.  You have a fever.  You keep throwing up (vomiting).  You have bloody or black, tarry poop (stool). MAKE SURE YOU:   Understand these instructions.  Will watch your condition.  Will get help right away if you are not doing well or get worse. Document Released: 03/18/2008 Document Revised: 10/05/2013 Document Reviewed: 08/25/2013 [facility] Patient Information 2015 [employer], [place]. This information is not intended to replace advice given to you by your health care provider. Make sure you discuss any questions you have with your health care provider.

## 2014-08-07 NOTE — ED Provider Notes (Signed)
Medical screening examination/treatment/procedure(s) were conducted as a shared visit with non-physician practitioner(s) or resident and myself. I personally evaluated the patient during the encounter and agree with the findings.  I have personally reviewed any xrays and/ or EKG's with the provider and I agree with interpretation.  Patient with high blood pressure, no significant abdominal history presents with right lower bowel pain since yesterday nausea and vomiting. Patient has focal right lower corner and tenderness, soft abdomen, no guarding. Concern for appendicitis versus less likely gallbladder/colitis vs UTI.  CT scan pending.  Labs Reviewed   CBC WITH DIFFERENTIAL - Abnormal; Notable for the following:    WBC  16.8 (*)     Hemoglobin  11.5 (*)     HCT  35.4 (*)     MCV  75.5 (*)     MCH  24.5 (*)     Neutrophils Relative %  90 (*)     Neutro Abs  15.2 (*)     Lymphocytes Relative  6 (*)     All other components within normal limits   COMPREHENSIVE METABOLIC PANEL - Abnormal; Notable for the following:    Glucose, Bld  192 (*)     BUN  26 (*)     Creatinine, Ser  1.12 (*)     Total Protein  9.3 (*)     Alkaline Phosphatase  119 (*)     GFR calc non Af Amer  53 (*)     GFR calc Af Amer  62 (*)     All other components within normal limits   URINALYSIS, ROUTINE W REFLEX MICROSCOPIC - Abnormal; Notable for the following:    APPearance  CLOUDY (*)     Glucose, UA  500 (*)     Hgb urine dipstick  TRACE (*)     Ketones, ur  40 (*)     Protein, ur  100 (*)     Nitrite  POSITIVE (*)     All other components within normal limits   URINE MICROSCOPIC-ADD ON - Abnormal; Notable for the following:    Bacteria, UA  MANY (*)     All other components within normal limits   LIPASE, BLOOD   '  Right lower quadrant abdominal pain, UTI   Enid SkeensJoshua M Cherl Gorney, MD 08/07/14 [phone]

## 2014-08-07 NOTE — Progress Notes (Signed)
UR completed 

## 2014-08-11 NOTE — Discharge Summary (Signed)
Please note that Caroline Moore was discharged on two antibiotics, Flagyl and Ciprofloxacin.  For some reason, these did not populate in the medication list, but are appropriately listed in the text.   I saw Caroline Moore on day of discharge and assisted in the planning.

## 2014-08-11 NOTE — ED Provider Notes (Signed)
Medical screening examination/treatment/procedure(s) were performed by non-physician practitioner and as supervising physician I was immediately available for consultation/collaboration.   EKG Interpretation None        Enid SkeensJoshua M Jamilyn Pigeon, MD 08/11/14 [phone]

## 2014-11-05 IMAGING — US US RENAL
1 series · 14 of 25 positions shown · non-contrast
Comparison: 01/27/2014

CLINICAL DATA: Acute renal insufficiency, UTI, history hypertension
and chronic kidney disease

EXAM:
RENAL/URINARY TRACT ULTRASOUND COMPLETE

[Series 1: us renal · 0.21mm/px · 14 of 42 slices shown]
[im 1/42]
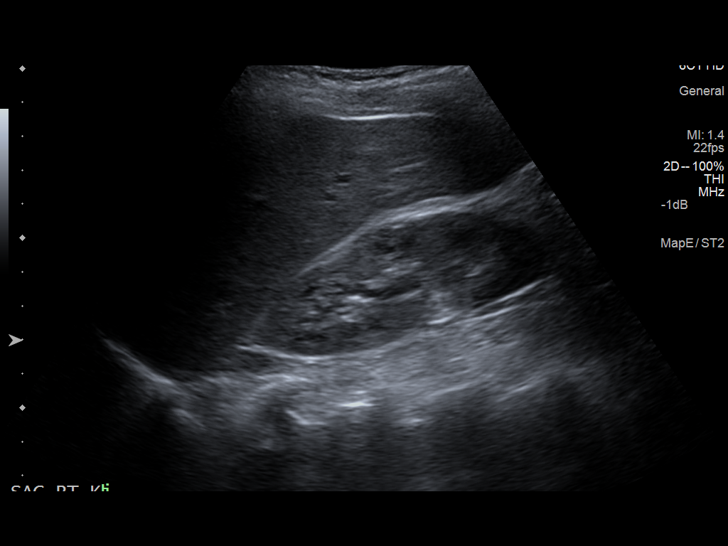
[im 4/42]
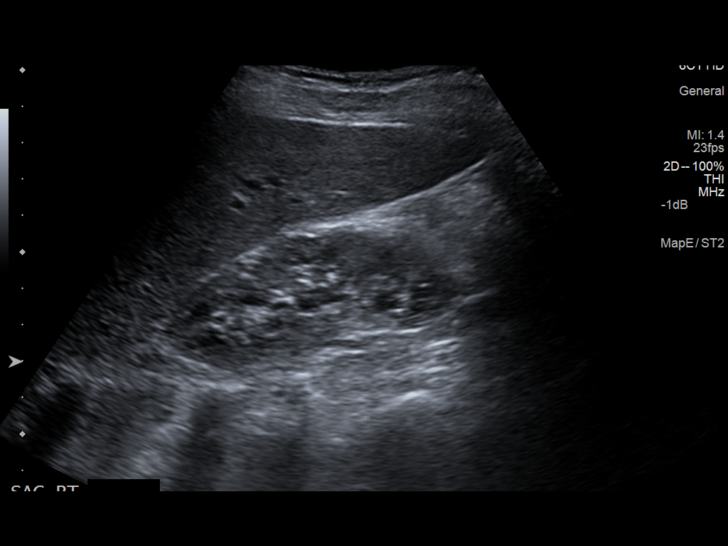
[im 7/42]
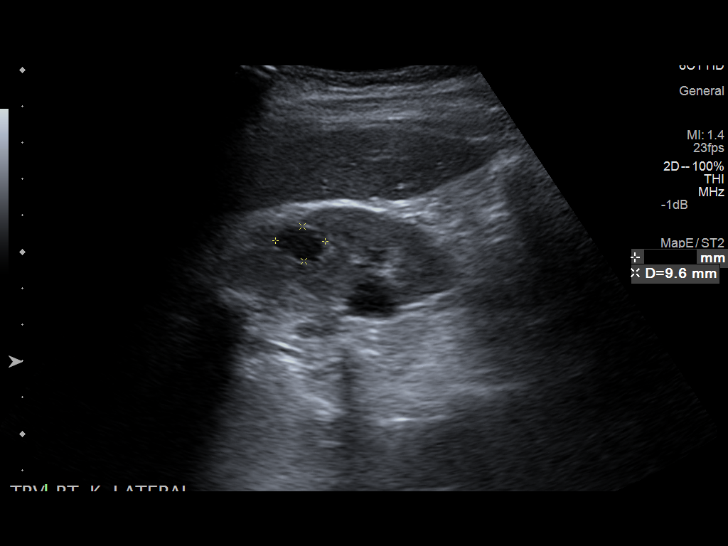
[im 11/42]
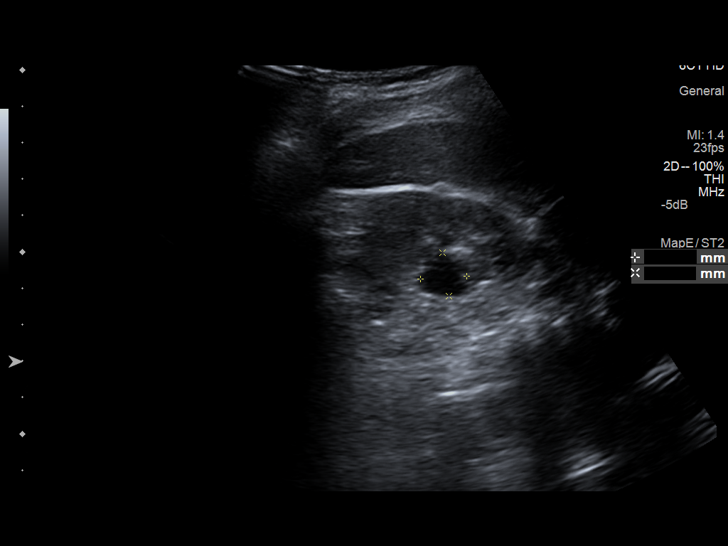
[im 14/42]
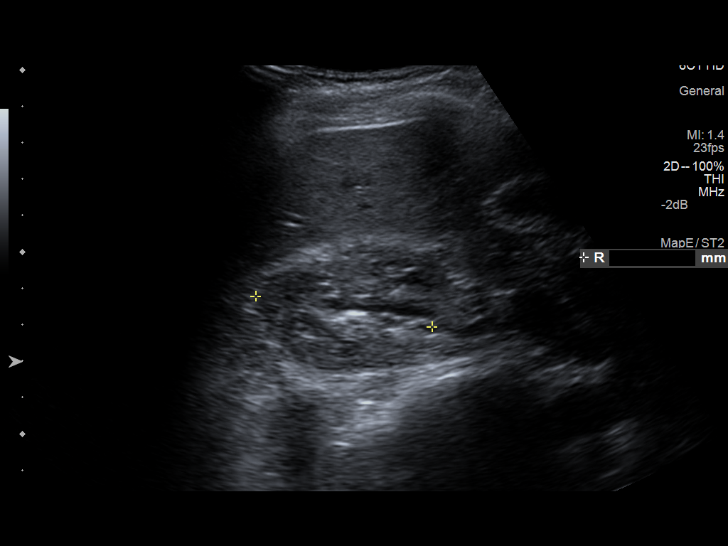
[im 16/42]
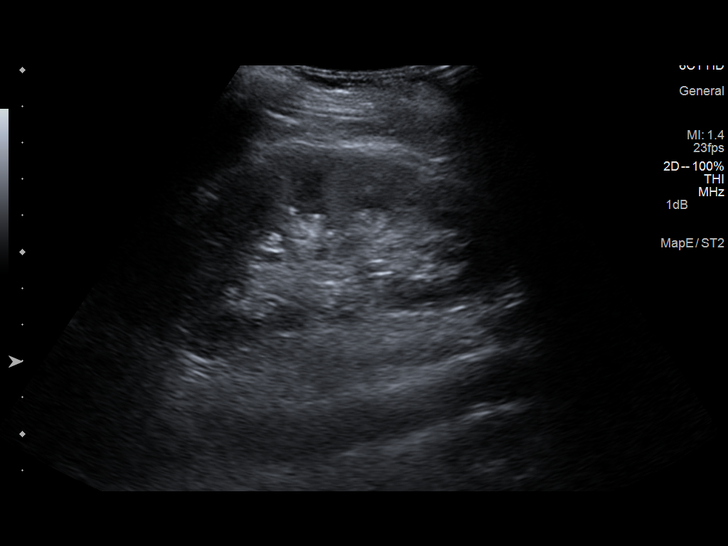
[im 19/42]
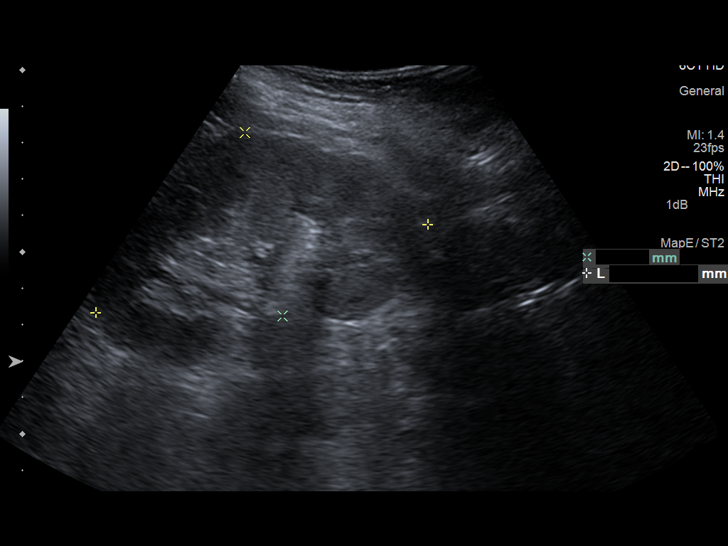
[im 23/42]
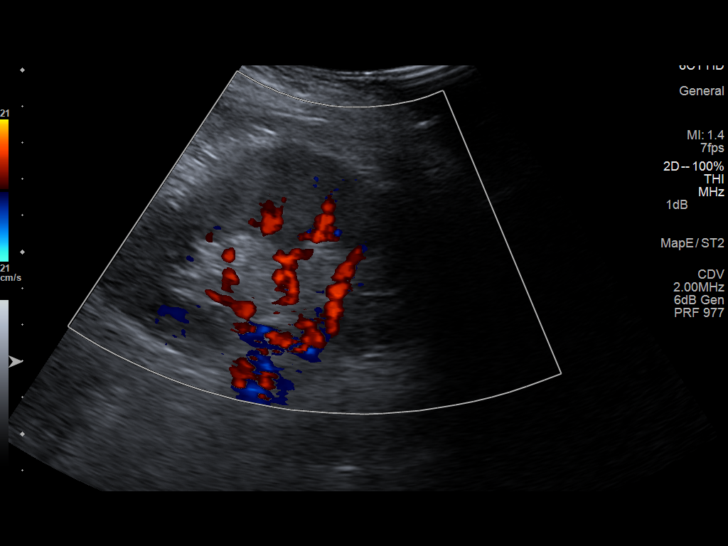
[im 26/42]
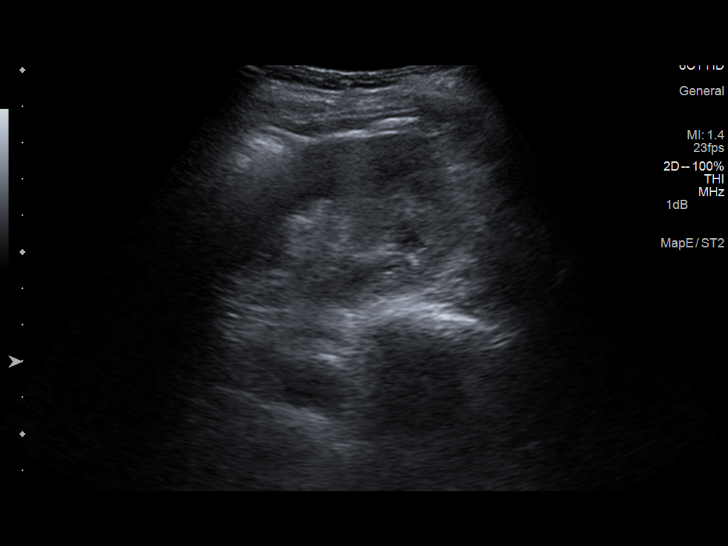
[im 28/42]
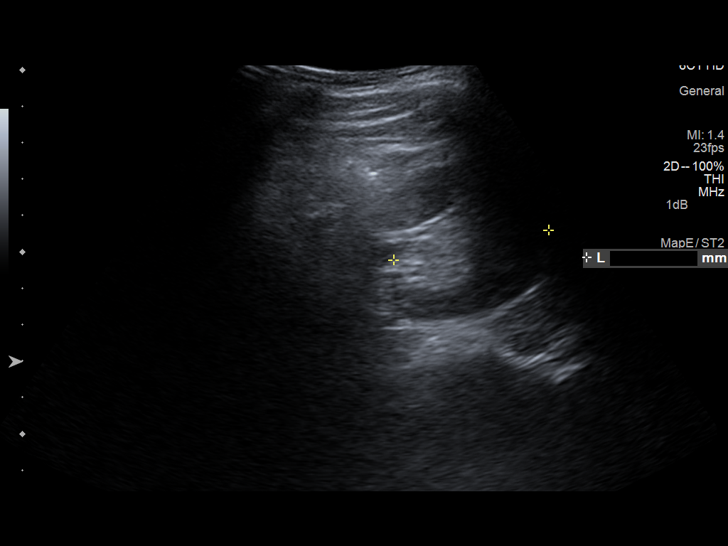
[im 31/42]
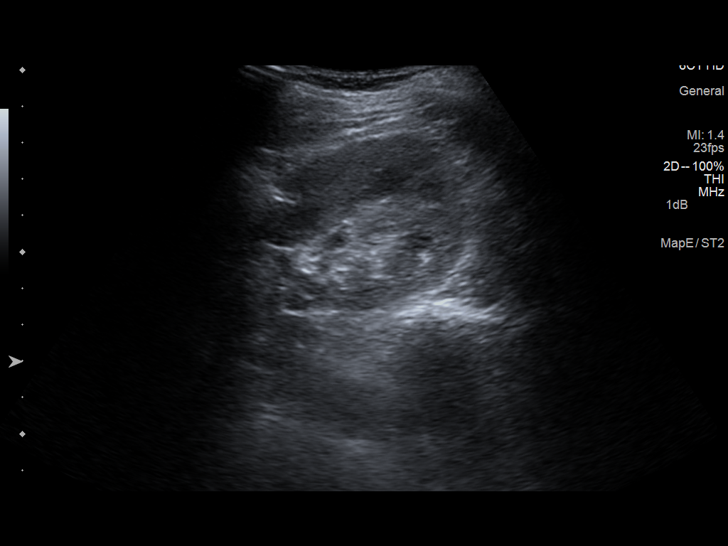
[im 35/42]
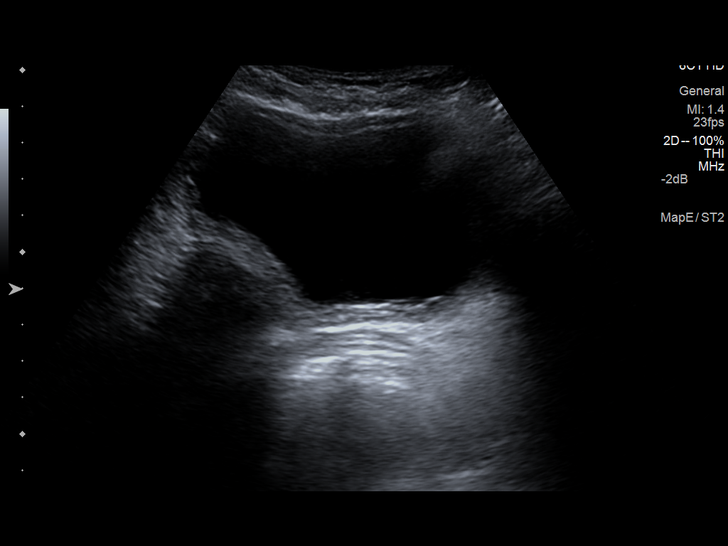
[im 38/42]
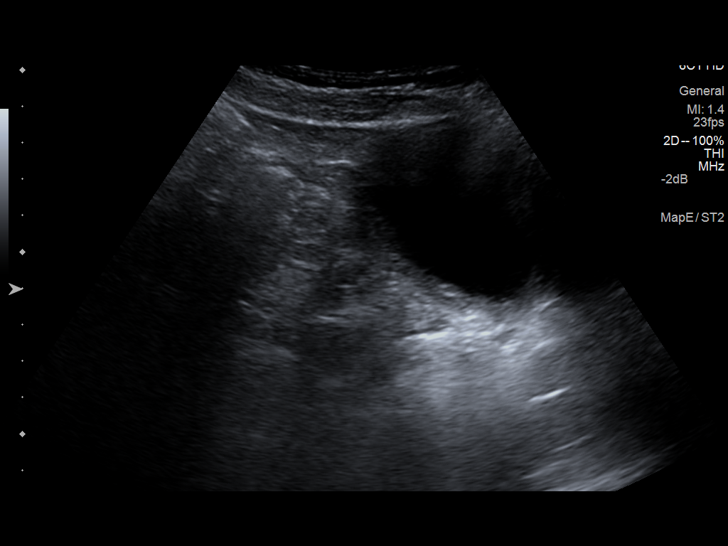
[im 42/42]
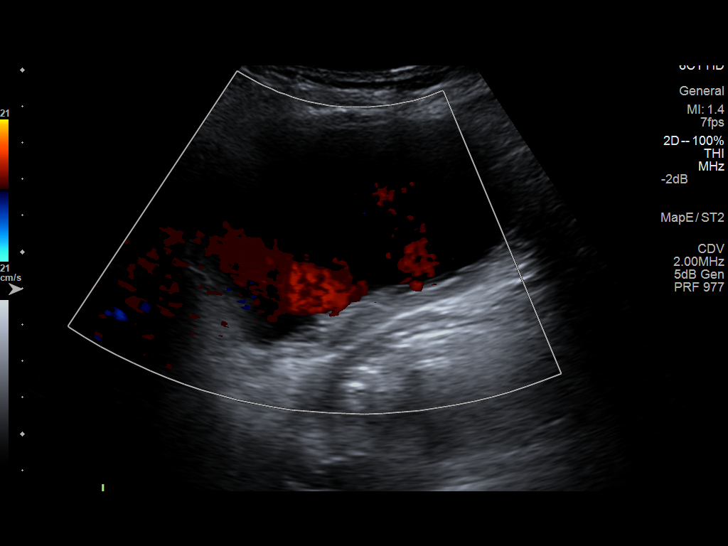

[14 of 25 positions shown; findings below may reference images not displayed]

FINDINGS: Right Kidney:

Length: 8.4 cm. Normal cortical thickness. Slightly increased
cortical echogenicity. Hypoechoic nodules at mid inferior pole,
measuring 1.4 x 1.2 x 1.1 cm and 1.3 x 1.3 x 1.2 cm. No solid mass,
hydronephrosis or shadowing calcification.

Left Kidney:

Length: 10.4 cm. Echogenicity within normal limits. No mass or
hydronephrosis visualized.

Bladder:

Appears normal for degree of bladder distention.
IMPRESSION: Small probable LEFT renal cysts, both slightly smaller than on
previous study.

No additional mass or hydronephrosis.

## 2015-01-04 IMAGING — CT CT ABD-PELV W/ CM
1 series · 14 of 23 positions shown, 18 images · IV contrast (omnipaque)
Comparison: None.

CLINICAL DATA: Acute right lower quadrant pain.

EXAM:
CT ABDOMEN AND PELVIS WITH CONTRAST
TECHNIQUE: Multidetector CT imaging of the abdomen and pelvis was performed
using the standard protocol following bolus administration of
intravenous contrast.
CONTRAST:  70mL OMNIPAQUE IOHEXOL 300 MG/ML  SOLN

[Series 201: kidney delay, idose (2) · axial · delayed · 0.60mm/px · z∈[+305,+405]mm · 14 of 23 slices shown, 18 images]
[im 2/23  soft-tissue]
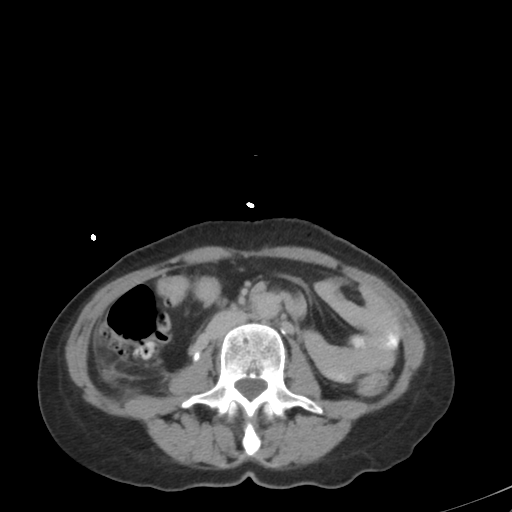
[im 2/23  bone]
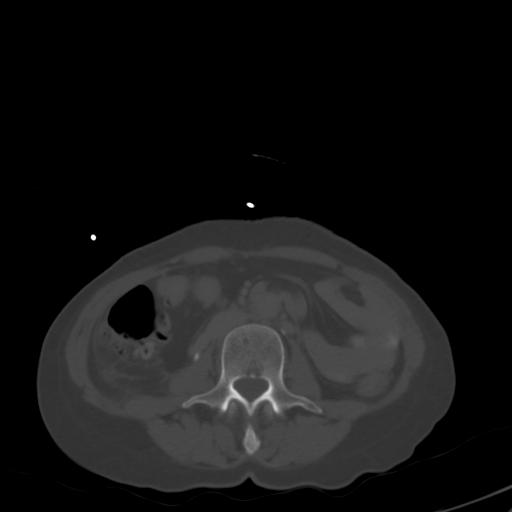
[im 4/23  soft-tissue]
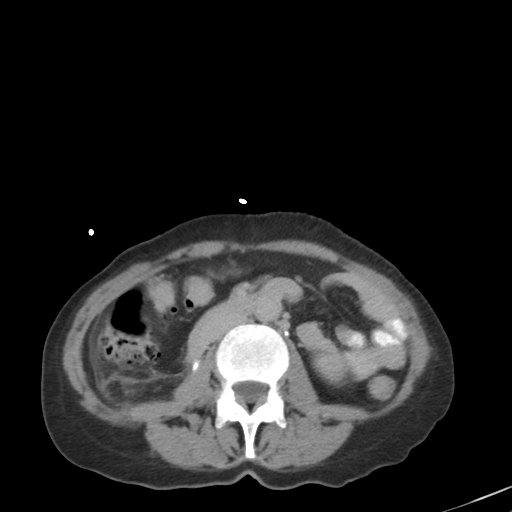
[im 6/23  soft-tissue]
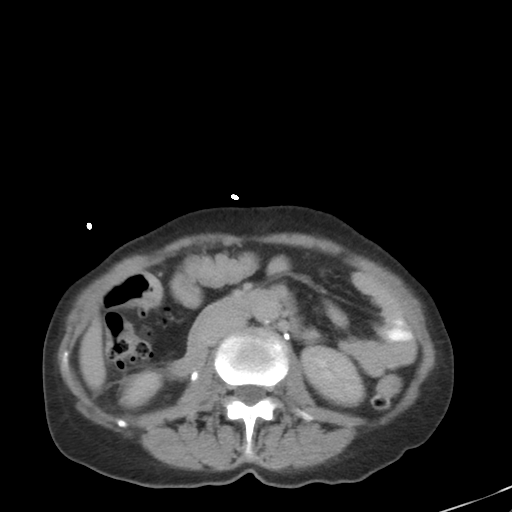
[im 8/23  soft-tissue]
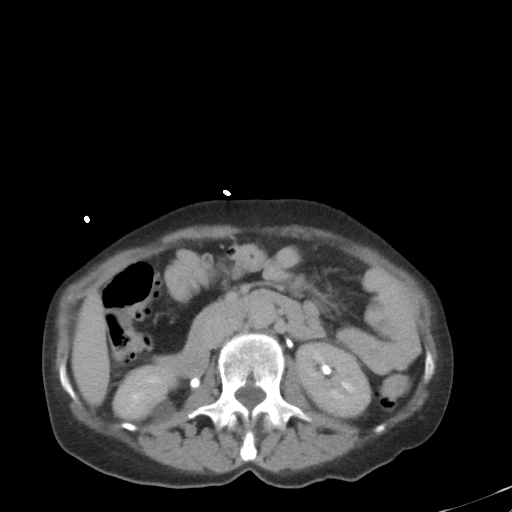
[im 9/23  soft-tissue]
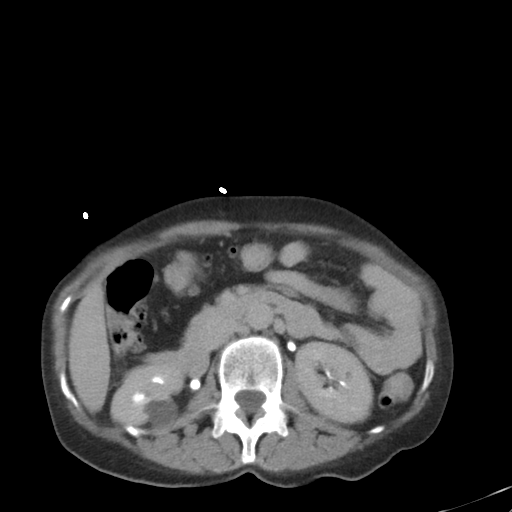
[im 11/23  soft-tissue]
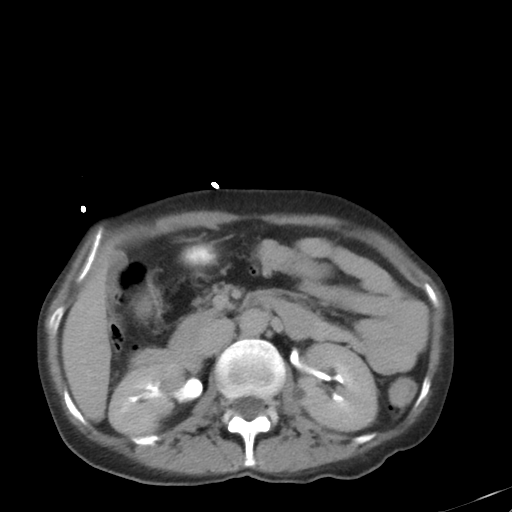
[im 13/23  soft-tissue]
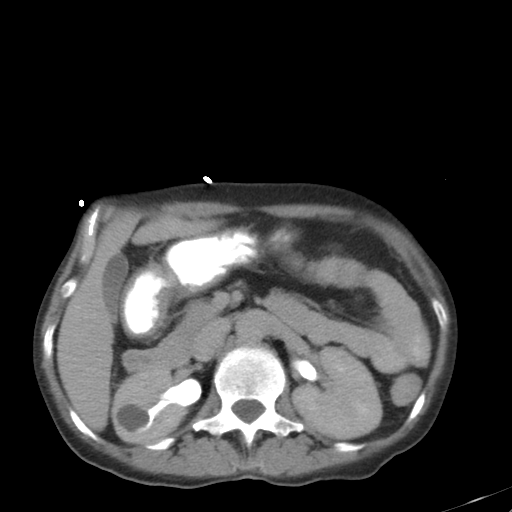
[im 15/23  soft-tissue]
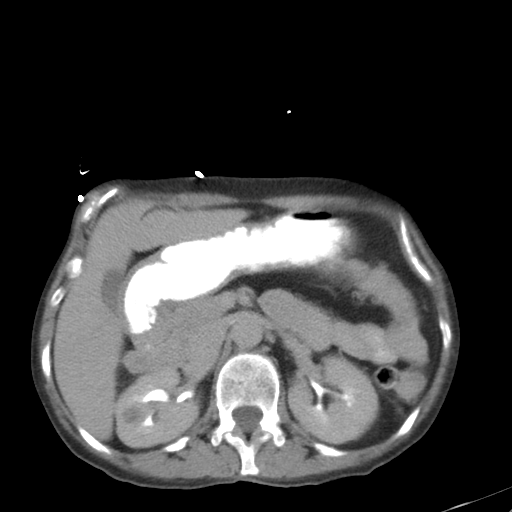
[im 16/23  soft-tissue]
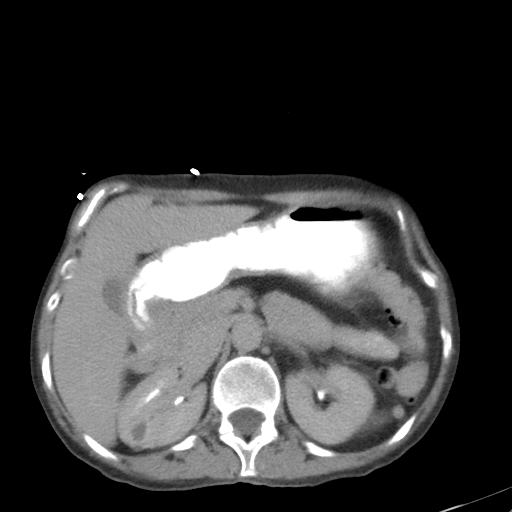
[im 16/23  bone]
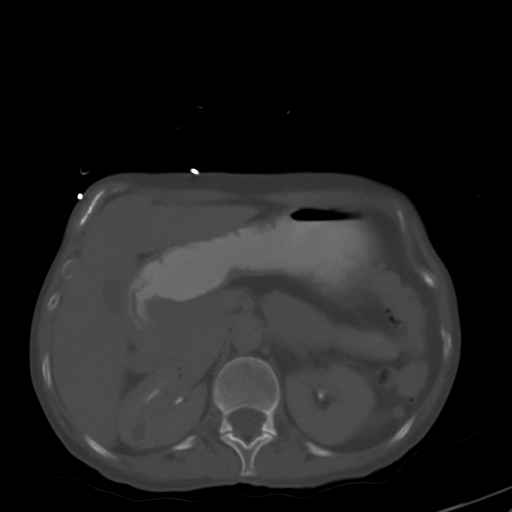
[im 18/23  soft-tissue]
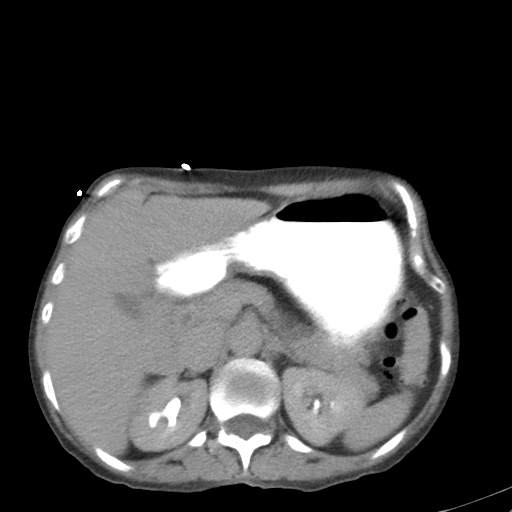
[im 19/23  lung]
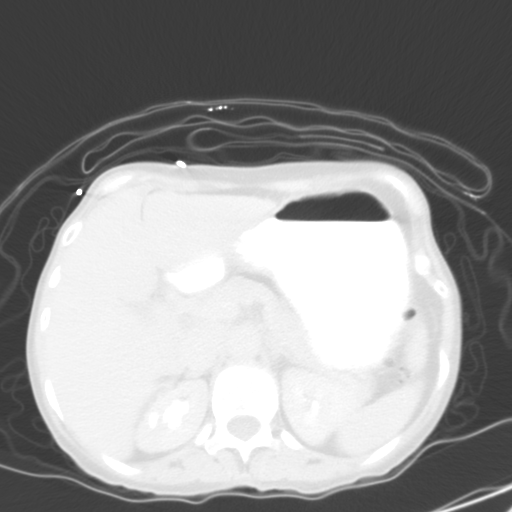
[im 20/23  soft-tissue]
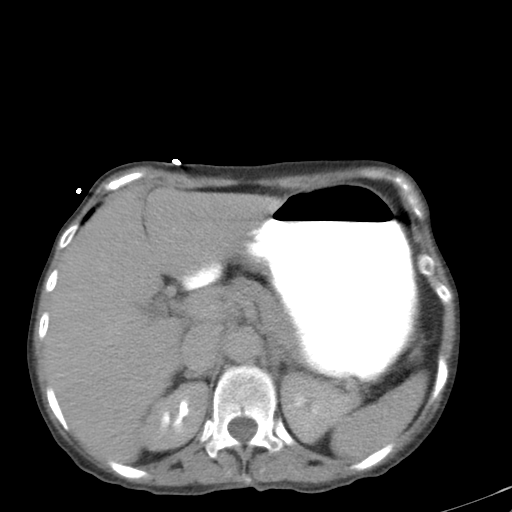
[im 20/23  lung]
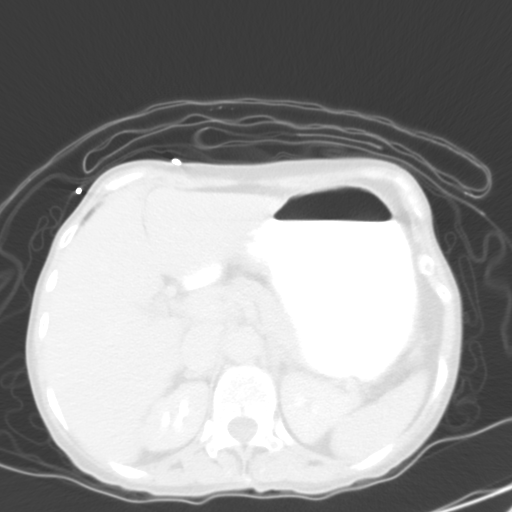
[im 21/23  lung]
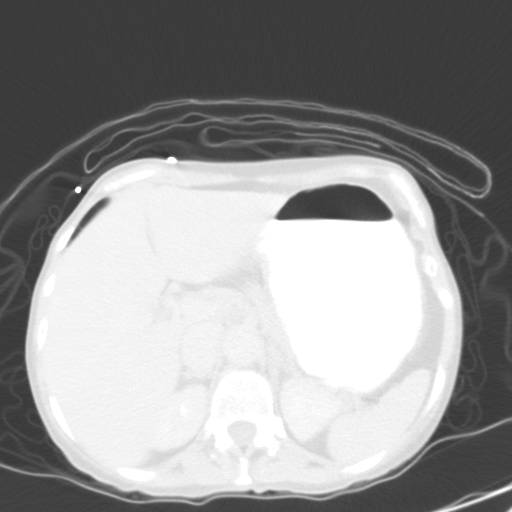
[im 22/23  soft-tissue]
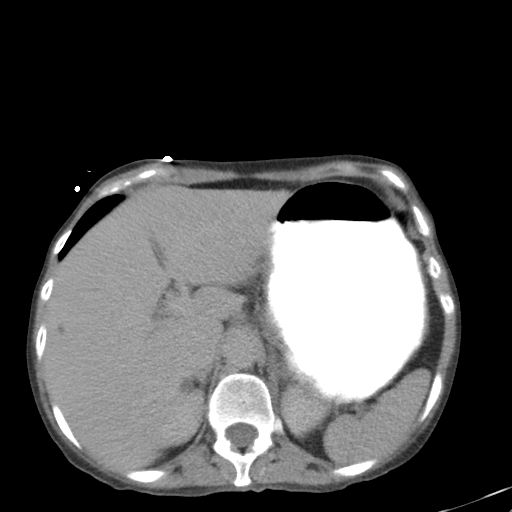
[im 22/23  lung]
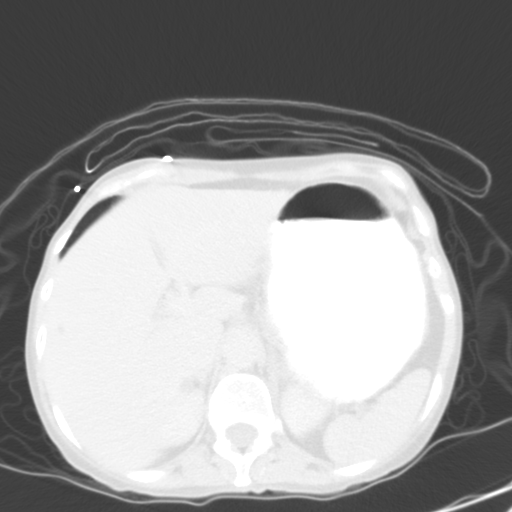

[14 of 23 positions shown; findings below may reference images not displayed]

FINDINGS: There are inflammatory changes surrounding the cecum and lower
ascending colon. No normal appendix is visualized. There are
multiple colonic diverticula, with diverticula noted along the cecum
and ascending colon. Several surgical vascular clips lie along the
posterior margin of the cecum anterior to the right psoas muscle.
Patient does not report previous surgery. In etiology of these is
unclear. Overall, the findings are most suggestive of right colon
diverticulitis. Appendicitis is not excluded. No abscess or
extraluminal air.

Numerous other uninflamed diverticula extend throughout the colon.
There are no other areas of colonic inflammation. Stool mild to
moderately distends the rectum.

Normal small bowel.

Lung bases essentially clear.  Heart is normal in size.

Sub cm low-density right lobe liver lesion most consistent with
cysts. Liver otherwise unremarkable.

Spleen, gallbladder, pancreas, adrenal glands:  Normal.

There are low-density renal lesions, mostly subcentimeter. Largest
is on the right at the midpole measuring 2 cm. This is a cyst. The
other lesions are too small to fully characterize but likely cysts.
There is mild right hydronephrosis and dilation of the proximal and
mid right ureter. This low grade partial obstruction appears to be
from the inflammatory change in the right lower quadrant affecting
the mid to distal right ureter. No renal or ureteral stones.

Bladder is only mildly distended. Wall appears mildly thickened. No
bladder mass or stone.

Uterus is deviated to the right. There are no adnexal masses.
Bilateral tubal ligation clips are noted.

No pathologically enlarged lymph nodes. There is no generalized
ascites.

There are degenerative changes of the lower lumbar spine. No
osteoblastic or osteolytic lesions.
IMPRESSION: 1. Inflammatory changes are centered about the cecum and lower
ascending colon. There are numerous cecal and right colon
diverticula. Findings are most suggestive of right colon
diverticulitis. However, no normal appendix is seen. Appendicitis is
not excluded. No abscess or extraluminal air.
2. Inflammatory changes in the right lower quadrant the 2 mild
dilation of the right intrarenal collecting system and proximal
right ureter.
3. No other acute findings.
